# Patient Record
Sex: Male | Born: 1975 | Hispanic: Yes | Marital: Married | Smoking: Never smoker
Health system: Southern US, Community
[De-identification: ages and names within clinical notes are randomized; demographics above are authoritative.]

## PROBLEM LIST (undated history)

## (undated) DIAGNOSIS — F32A Depression, unspecified: Secondary | ICD-10-CM

## (undated) DIAGNOSIS — Z7689 Persons encountering health services in other specified circumstances: Secondary | ICD-10-CM

## (undated) DIAGNOSIS — I10 Essential (primary) hypertension: Secondary | ICD-10-CM

## (undated) DIAGNOSIS — F329 Major depressive disorder, single episode, unspecified: Secondary | ICD-10-CM

## (undated) HISTORY — DX: Essential (primary) hypertension: I10

## (undated) HISTORY — PX: SPLENECTOMY, TOTAL: SHX788

## (undated) HISTORY — PX: HERNIA REPAIR: SHX51

---
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
## 2001-06-03 ENCOUNTER — Encounter: Payer: Self-pay | Admitting: Emergency Medicine

## 2001-06-03 ENCOUNTER — Emergency Department (HOSPITAL_COMMUNITY): Admission: EM | Admit: 2001-06-03 | Discharge: 2001-06-03 | Payer: Self-pay | Admitting: Emergency Medicine

## 2003-10-30 ENCOUNTER — Emergency Department (HOSPITAL_COMMUNITY): Admission: EM | Admit: 2003-10-30 | Discharge: 2003-10-30 | Payer: Self-pay | Admitting: Family Medicine

## 2010-07-23 ENCOUNTER — Ambulatory Visit (HOSPITAL_COMMUNITY): Admission: RE | Admit: 2010-07-23 | Discharge: 2010-07-23 | Payer: Self-pay | Admitting: Surgery

## 2010-11-18 LAB — BASIC METABOLIC PANEL
BUN: 11 mg/dL (ref 6–23)
CO2: 27 mEq/L (ref 19–32)
Calcium: 9.4 mg/dL (ref 8.4–10.5)
Chloride: 105 mEq/L (ref 96–112)
Creatinine, Ser: 0.64 mg/dL (ref 0.4–1.5)
GFR calc Af Amer: >60 mL/min (ref >60)
GFR calc non Af Amer: >60 mL/min (ref >60)
Glucose, Bld: 92 mg/dL (ref 70–99)
Potassium: 4 mEq/L (ref 3.5–5.1)
Sodium: 141 mEq/L (ref 135–145)

## 2010-11-18 LAB — CBC
HCT: 43 % (ref 39.0–52.0)
Hemoglobin: 14.8 g/dL (ref 13.0–17.0)
MCH: 32.3 pg (ref 26.0–34.0)
MCHC: 34.3 g/dL (ref 30.0–36.0)
MCV: 94.2 fL (ref 78.0–100.0)
Platelets: 380 10*3/uL (ref 150–400)
RBC: 4.57 MIL/uL (ref 4.22–5.81)
RDW: 13.4 % (ref 11.5–15.5)
WBC: 6.6 10*3/uL (ref 4.0–10.5)

## 2010-11-18 LAB — DIFFERENTIAL
Basophils Absolute: 0.1 10*3/uL (ref 0.0–0.1)
Basophils Relative: 1 % (ref 0–1)
Eosinophils Absolute: 0.2 10*3/uL (ref 0.0–0.7)
Eosinophils Relative: 3 % (ref 0–5)
Lymphocytes Relative: 50 % — ABNORMAL HIGH (ref 12–46)
Lymphs Abs: 3.3 10*3/uL (ref 0.7–4.0)
Monocytes Absolute: 0.7 10*3/uL (ref 0.1–1.0)
Monocytes Relative: 10 % (ref 3–12)
Neutro Abs: 2.4 10*3/uL (ref 1.7–7.7)
Neutrophils Relative %: 36 % — ABNORMAL LOW (ref 43–77)

## 2010-11-18 LAB — SURGICAL PCR SCREEN
MRSA, PCR: NEGATIVE
Staphylococcus aureus: POSITIVE — AB

## 2010-12-24 ENCOUNTER — Other Ambulatory Visit (HOSPITAL_COMMUNITY): Payer: Self-pay | Admitting: Urology

## 2010-12-24 DIAGNOSIS — N410 Acute prostatitis: Secondary | ICD-10-CM

## 2010-12-30 ENCOUNTER — Ambulatory Visit (HOSPITAL_COMMUNITY)
Admission: RE | Admit: 2010-12-30 | Discharge: 2010-12-30 | Disposition: A | Discharge status: Home / Self Care | Payer: Self-pay | Source: Ambulatory Visit | Attending: Urology | Admitting: Urology

## 2010-12-30 ENCOUNTER — Other Ambulatory Visit (HOSPITAL_COMMUNITY): Payer: Self-pay | Admitting: Urology

## 2010-12-30 DIAGNOSIS — A499 Bacterial infection, unspecified: Secondary | ICD-10-CM | POA: Insufficient documentation

## 2010-12-30 DIAGNOSIS — R350 Frequency of micturition: Secondary | ICD-10-CM | POA: Insufficient documentation

## 2010-12-30 DIAGNOSIS — Z9089 Acquired absence of other organs: Secondary | ICD-10-CM | POA: Insufficient documentation

## 2010-12-30 DIAGNOSIS — N509 Disorder of male genital organs, unspecified: Secondary | ICD-10-CM | POA: Insufficient documentation

## 2010-12-30 DIAGNOSIS — B9689 Other specified bacterial agents as the cause of diseases classified elsewhere: Secondary | ICD-10-CM | POA: Insufficient documentation

## 2010-12-30 DIAGNOSIS — N411 Chronic prostatitis: Secondary | ICD-10-CM | POA: Insufficient documentation

## 2010-12-30 DIAGNOSIS — R300 Dysuria: Secondary | ICD-10-CM | POA: Insufficient documentation

## 2010-12-30 DIAGNOSIS — N410 Acute prostatitis: Secondary | ICD-10-CM

## 2010-12-30 DIAGNOSIS — K7689 Other specified diseases of liver: Secondary | ICD-10-CM | POA: Insufficient documentation

## 2010-12-30 MED ORDER — IOHEXOL 300 MG/ML  SOLN
100.0000 mL | Freq: Once | INTRAMUSCULAR | Status: AC | PRN
  Administered 2010-12-30: 100 mL via INTRAVENOUS

## 2017-10-15 ENCOUNTER — Ambulatory Visit: Payer: Self-pay | Admitting: Family Medicine

## 2017-10-15 ENCOUNTER — Encounter: Payer: Self-pay | Admitting: Psychology

## 2017-10-15 ENCOUNTER — Encounter: Payer: Self-pay | Admitting: Family Medicine

## 2017-10-15 ENCOUNTER — Other Ambulatory Visit: Payer: Self-pay

## 2017-10-15 VITALS — BP 128/88 | HR 78 | Temp 98.3°F | Ht 63.0 in | Wt 231.0 lb

## 2017-10-15 DIAGNOSIS — Z7689 Persons encountering health services in other specified circumstances: Secondary | ICD-10-CM

## 2017-10-15 DIAGNOSIS — G629 Polyneuropathy, unspecified: Secondary | ICD-10-CM

## 2017-10-15 DIAGNOSIS — Z9081 Acquired absence of spleen: Secondary | ICD-10-CM | POA: Insufficient documentation

## 2017-10-15 DIAGNOSIS — F329 Major depressive disorder, single episode, unspecified: Secondary | ICD-10-CM

## 2017-10-15 DIAGNOSIS — F32A Depression, unspecified: Secondary | ICD-10-CM

## 2017-10-15 HISTORY — DX: Polyneuropathy, unspecified: G62.9

## 2017-10-15 HISTORY — DX: Persons encountering health services in other specified circumstances: Z76.89

## 2017-10-15 MED ORDER — GABAPENTIN 100 MG PO CAPS: 100.0000 mg | ORAL_CAPSULE | Freq: Three times a day (TID) | ORAL | 3 refills | Status: DC

## 2017-10-15 NOTE — Assessment & Plan Note (Signed)
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  He would like to defer and obtain his prior records.   We discussed his increased risk and need for pnuemococcal, meningococcal, hemop vax

## 2017-10-15 NOTE — Patient Instructions (Addendum)
It was a pleasure to see you today! Thank you for choosing [facility] for your primary care. Eddie [name] was seen for establishing case. Come back to the clinic if you have any new concerns, and go to the emergency room if you have any life threatening problems or concerns of self-harm.  Today we reviewed some of your medical history and medications.   We also connected you with our behavioral health dept. For help with the depression symptoms we discussed.  We're going to continue your prior gabapentin medication.  We'll continue your general health screening when you get your insurance arranged because we know you are concerned about costs.  If we did any lab work today, and the results require attention, either me or my nurse will get in touch with you. If everything is normal, you will get a letter in mail and a message via . If you don't hear from [place] in two weeks, please give [place] a call. Otherwise, we look forward to seeing you again at your next visit. If you have any questions or concerns before then, please call the clinic at [phone].  Please bring all your medications to every doctors visit  Sign up for My Chart to have easy access to your labs results, and communication with your Primary care physician.    Please check-out at the front desk before leaving the clinic.    Best,  Dr. Sherene Sires FAMILY MEDICINE RESIDENT - PGY1 10/15/2017 2:34 PM

## 2017-10-15 NOTE — Progress Notes (Signed)
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   Presenting Issue: Patient reported feeling stress related to financial difficulties and damage to his home. He reported experiencing negative thoughts about self and the future, lack of interest in socialiazing or engagnig in previously enjoyed activities, feeling agitated and having troubles concentrating.   Report of symptoms:  Symptoms began roughly 6 months ago after the damage to his house and related financial stress.  Impact on function: Increased gitation with wife, has stopped playing soccer, and has trouble focusing at school.   Independently, wife reported patient is much more agitated with her and at times is physical with her, which is a change in behavior.  Psychiatric History - Diagnoses: none - Hospitalizations: none - Pharmacotherapy: Yes, but could not recall medication name - Outpatient therapy: marriage counseling but stopped after several sessions because he does "not believe" in counseling.   Family history of psychiatric issues:  None   Current and history of substance use:  denied all   PHQ-9: 14, denied item 9 SI   Warmhandoff:  complete

## 2017-10-15 NOTE — Assessment & Plan Note (Signed)
Assessment / Plan / Recommendations:  Patient presented with moderate depressive symptoms (PHQ9 =14, denied item 9, SI). His affect was blunted, he avoided eye-contact throughout the appointment but was forthcoming with information.   In the past 6 months, patient has been dealing with a lot of stress related to damage to his house resulting in financial difficulties, which likely contributed to his initial symptoms. Currently, his social isolation and disengagement from hobbies, such as soccer, likely maintain his depressive symptoms. Additionally, his symptoms are impacting his relationship with his wife, as he has become much more irritable. [facility] provided psychoeducation about depression and the link between withdrawing and maintenance of symptoms. Though initially patient had expressed some reluctance about engaging in therapy, he was open to seeing [facility] again and will return for appointment on 03/08.

## 2017-10-15 NOTE — Progress Notes (Signed)
ROI signed and placed in to be faxed pile for [employer] surgery and [employer]. Artelia Game, Salome Spotted, CMA

## 2017-10-15 NOTE — Assessment & Plan Note (Signed)
Bilateral heel spurs seen by ortho in past, will offer low dose gabapentin without having access to prior records

## 2017-10-15 NOTE — Assessment & Plan Note (Addendum)
Denies si/hi.  Refuses medication discussion.  Agreed to warm hand off.  PHQ 14

## 2017-10-15 NOTE — Progress Notes (Signed)
    Subjective:  Eddie [name] is a 42 y.o. male who presents to the [facility] today with a chief complaint of establishing care.   HPI: Patient presents to establish care.  He is newly back [place] the country from [place] and has has significant social stressors that are contributing to relationship problems and depressive symptoms (phq14).  He denies SI/HI.   Says he has had weight gain, stopped playing soccer, is stressed out about school/work/home repairs.  He is willing to speak with [facility] but refuses offers of medical assistance for depression.    His insurance is not in place yet so he refuses standard screenings/vaccines even though we discussed increased risks with a h/o splenectomy.  His only other surgery noted is a hernia repair that he says he has had no complications from.   Objective:  Physical Exam: BP 128/88   Pulse 78   Temp 98.3 F (36.8 C) (Oral)   Ht 5\' 3"  (1.6 m)   Wt 231 lb (104.8 kg)   SpO2 98%   BMI 40.92 kg/m   Gen: NAD, sitting comfortably, mildly obese CV: RRR with no murmurs appreciated Pulm: NWOB, CTAB with no crackles, wheezes, or rhonchi GI: Normal bowel sounds present. Soft, Nontender, Nondistended. MSK: no edema, cyanosis, or clubbing noted.  Some expressed neuropathy in heels but not to palpation. Skin: warm, dry Neuro: grossly normal, moves all extremities Psych: Normal affect and thought content, does express depressive apathy (phq9)  No results found for this or any previous visit (from the past 72 hour(s)).   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  He would like to defer and obtain his prior records.   We discussed his increased risk and need for pnuemococcal, meningococcal, hemop vax  Depression Denies si/hi.  Refuses medication discussion.  Agreed to warm hand off.  PHQ 14  Neuropathy Bilateral heel spurs seen  by ortho in past, will offer low dose gabapentin without having access to prior records   Sherene Sires, [place] - PGY1 10/15/2017 3:36 PM

## 2017-11-12 ENCOUNTER — Ambulatory Visit: Payer: Self-pay

## 2019-04-10 ENCOUNTER — Ambulatory Visit: Payer: BLUE CROSS/BLUE SHIELD | Admitting: Family Medicine

## 2019-04-12 ENCOUNTER — Encounter: Payer: Self-pay | Admitting: Family Medicine

## 2019-04-12 ENCOUNTER — Other Ambulatory Visit: Payer: Self-pay

## 2019-04-12 ENCOUNTER — Ambulatory Visit (INDEPENDENT_AMBULATORY_CARE_PROVIDER_SITE_OTHER): Payer: Self-pay | Admitting: Family Medicine

## 2019-04-12 VITALS — BP 112/86 | HR 89 | Wt 245.2 lb

## 2019-04-12 DIAGNOSIS — F329 Major depressive disorder, single episode, unspecified: Secondary | ICD-10-CM

## 2019-04-12 DIAGNOSIS — Z0000 Encounter for general adult medical examination without abnormal findings: Secondary | ICD-10-CM

## 2019-04-12 DIAGNOSIS — F32A Depression, unspecified: Secondary | ICD-10-CM

## 2019-04-12 MED ORDER — FLUOXETINE HCL 10 MG PO CAPS: 10.0000 mg | ORAL_CAPSULE | Freq: Every day | ORAL | 1 refills | Status: DC

## 2019-04-12 NOTE — Patient Instructions (Signed)
It was a pleasure to see you today! Thank you for choosing [facility] for your primary care. Eddie [name] was seen for depression. Come back to the clinic in 5 weeks.   Today we talked about starting your depression medicine.  We ordered the Prozac because it is very expensive and you can get this at [facility] for good pricing cash.  We talked about how you need to follow-up with me in about 5 weeks.  At that point we can decide if this is a medicine that is working for you or if we need to go up on the dose.  If at any point you start having negative symptoms or this seems to be causing significant problems for you including any thoughts of harming yourself, please stop the medicine immediately and call me back and we can talk about the next step.   Please bring all your medications to every doctors visit   Sign up for My Chart to have easy access to your labs results, and communication with your Primary care physician.     Please check-out at the front desk before leaving the clinic.     Best,  Dr. Sherene Sires FAMILY MEDICINE RESIDENT - PGY3 04/12/2019 3:18 PM

## 2019-04-13 ENCOUNTER — Encounter: Payer: Self-pay | Admitting: Family Medicine

## 2019-04-13 DIAGNOSIS — Z0000 Encounter for general adult medical examination without abnormal findings: Secondary | ICD-10-CM | POA: Insufficient documentation

## 2019-04-13 NOTE — Assessment & Plan Note (Signed)
Patient said that his depression has been impacting his relationship at home and is willing to consider medication at this point.  He said in the past he had been on Xanax briefly and wanted to know if we could do that.  He did have significant findings on his gad and PDS Q9 with scores of 18 and 23.  He said that he did have occasional feelings of wondering if he needed to be on this earth but firmly denied any thoughts or intentions of actually harming himself and said he would never lay hands on his family.  He is quite adamant that he is safe but afraid he is harming his relationship with his depression.  We discussed pros and cons of medication he said he was on sertraline in the past and did not think that was useful for him.  We discussed positive and negative effects of medications and that positive can take up to 5 to 6 weeks to appear.  He agreed to start on fluoxetine so we started on low-dose fluoxetine and will reevaluate in 5 to 6 weeks, we went over return precautions to the ED into the clinic in case negative effects occur including suicidal thoughts..  Patient is self-pay and declines referral to therapy and psychiatry because he has would not be able to afford it.

## 2019-04-13 NOTE — Progress Notes (Signed)
    Subjective:  Eddie [name] is a 43 y.o. male who presents to the [facility] today with a chief complaint of depression.   HPI: Depression Patient said that his depression has been impacting his relationship at home and is willing to consider medication at this point.  He said in the past he had been on Xanax briefly and wanted to know if we could do that.  He did have significant findings on his gad and PDS Q9 with scores of 18 and 23.  He said that he did have occasional feelings of wondering if he needed to be on this earth but firmly denied any thoughts or intentions of actually harming himself and said he would never lay hands on his family.  He is quite adamant that he is safe but afraid he is harming his relationship with his depression.  Healthcare maintenance Patient self pay and will go to the, health department to get his HIV drawn.  Objective:  Physical Exam: BP 112/86   Pulse 89   Wt 245 lb 3.2 oz (111.2 kg)   SpO2 95%   BMI 43.44 kg/m   Gen: NAD, resting comfortably MSK: no edema, cyanosis, or clubbing noted Skin: warm, dry Neuro: grossly normal, moves all extremities Psych: Normal affect and thought content  No results found for this or any previous visit (from the past 72 hour(s)).   Assessment/Plan:  Depression Patient said that his depression has been impacting his relationship at home and is willing to consider medication at this point.  He said in the past he had been on Xanax briefly and wanted to know if we could do that.  He did have significant findings on his gad and PDS Q9 with scores of 18 and 23.  He said that he did have occasional feelings of wondering if he needed to be on this earth but firmly denied any thoughts or intentions of actually harming himself and said he would never lay hands on his family.  He is quite adamant that he is safe but afraid he is harming his relationship with his depression.  We discussed pros and cons of medication he said he was  on sertraline in the past and did not think that was useful for him.  We discussed positive and negative effects of medications and that positive can take up to 5 to 6 weeks to appear.  He agreed to start on fluoxetine so we started on low-dose fluoxetine and will reevaluate in 5 to 6 weeks, we went over return precautions to the ED into the clinic in case negative effects occur including suicidal thoughts..  Patient is self-pay and declines referral to therapy and psychiatry because he has would not be able to afford it.  Healthcare maintenance Patient self pay and will go to the, health department to get his HIV drawn.   Sherene Sires, DO FAMILY MEDICINE RESIDENT - PGY3 04/13/2019 10:57 AM \

## 2019-04-13 NOTE — Assessment & Plan Note (Signed)
Patient self pay and will go to the, health department to get his HIV drawn.

## 2019-06-12 ENCOUNTER — Telehealth: Payer: Self-pay

## 2019-06-12 ENCOUNTER — Other Ambulatory Visit: Payer: Self-pay

## 2019-06-12 DIAGNOSIS — F329 Major depressive disorder, single episode, unspecified: Secondary | ICD-10-CM

## 2019-06-12 DIAGNOSIS — G629 Polyneuropathy, unspecified: Secondary | ICD-10-CM

## 2019-06-12 DIAGNOSIS — F32A Depression, unspecified: Secondary | ICD-10-CM

## 2019-06-12 MED ORDER — FLUOXETINE HCL 10 MG PO CAPS: 10.0000 mg | ORAL_CAPSULE | Freq: Every day | ORAL | 1 refills | Status: DC

## 2019-06-12 MED ORDER — GABAPENTIN 100 MG PO CAPS: 100.0000 mg | ORAL_CAPSULE | Freq: Three times a day (TID) | ORAL | 3 refills | Status: DC

## 2019-06-12 NOTE — Telephone Encounter (Signed)
Pt wife [name] calling to get Prozac refilled at an increase dose from 10 to 20 mg. Patient would also like a 3 month supply of tablets.  Please send to [facility] on [address]. Ottis Stain, CMA

## 2019-06-12 NOTE — Telephone Encounter (Signed)
Patients wife calls back and apologizes for rushing off. Pharmacy calcification, [facility] [address]. Will cancel at [facility] and call to preferred pharmacy.

## 2019-06-12 NOTE — Telephone Encounter (Signed)
Acknowledge the patient's wife called in saying that they wanted to increase Prozac to 20 mg from 10 mg.  Given the amount of time that he has been on the 10 mg I would be open this discussion but we do need to have it, patient needs to be scheduled for at least a telemedicine visit with 1 of our physicians before this can be increased.  Dr. Criss Rosales

## 2019-06-12 NOTE — Telephone Encounter (Signed)
Patients wife calls nurse line stating she has been trying to get his fluoxetine and gabapentin refilled. I informed her they were approved and more than likely ready for pick up at [facility]. Pts wife then stated, "they were not supposed to go to [place], but [facility] on [address]." Patient was then speaking with someone else, and was not clear which [facility], as in chart they use the one on [address]. Patient then stated, "my doctor is in I have to go."

## 2019-06-13 ENCOUNTER — Telehealth (INDEPENDENT_AMBULATORY_CARE_PROVIDER_SITE_OTHER): Payer: Self-pay | Admitting: Family Medicine

## 2019-06-13 ENCOUNTER — Other Ambulatory Visit: Payer: Self-pay

## 2019-06-13 ENCOUNTER — Other Ambulatory Visit: Payer: Self-pay | Admitting: Family Medicine

## 2019-06-13 DIAGNOSIS — F32A Depression, unspecified: Secondary | ICD-10-CM

## 2019-06-13 DIAGNOSIS — F329 Major depressive disorder, single episode, unspecified: Secondary | ICD-10-CM

## 2019-06-13 MED ORDER — FLUOXETINE HCL 20 MG PO CAPS: 20.0000 mg | ORAL_CAPSULE | Freq: Every day | ORAL | 1 refills | Status: DC

## 2019-06-13 NOTE — Progress Notes (Signed)
Virtual Visit via Telephone Note  I connected with Loma Boston on 06/13/19 at  8:30 AM EDT by telephone and verified that I am speaking with the correct person using two identifiers.  Location: Patient: Home Provider: [facility]   I discussed the limitations, risks, security and privacy concerns of performing an evaluation and management service by telephone and the availability of in person appointments. I also discussed with the patient that there may be a patient responsible charge related to this service. The patient expressed understanding and agreed to proceed.   History of Present Illness: Patient with history of depression, has been impacting his relationship with his wife.  In the past he had used Xanax before establishing here but we discussed changing to an SSRI.  No history of bipolar, denies suicidal or homicidal ideation.  He said that he has been doing well on Prozac 10 mg but thought that it was not quite doing enough.  He said specifically there are no problems with this medication.   Observations/Objective: Patient speaking in full sentences, understands the concept of positive negative symptoms.  Processing information appropriately  Assessment and Plan: Will increase dose from 10 mg to 20 mg Prozac.  Reconfirmed concept of positive versus negative symptoms.  If negative symptoms patient will call back immediately and discussed with [place] to consider stopping, if positive symptoms do not reach desired effect we can discuss additional medication or increased dosing at 5 to 6 weeks.  Follow Up Instructions:    I discussed the assessment and treatment plan with the patient. The patient was provided an opportunity to ask questions and all were answered. The patient agreed with the plan and demonstrated an understanding of the instructions.   The patient was advised to call back or seek an in-person evaluation if the symptoms worsen or if the condition fails to improve as  anticipated.  I provided 8 minutes of non-face-to-face time during this encounter.   Sherene Sires, DO

## 2019-06-13 NOTE — Telephone Encounter (Signed)
Tele med visit completed today. Ottis Stain, CMA

## 2019-07-31 ENCOUNTER — Other Ambulatory Visit: Payer: Self-pay | Admitting: Family Medicine

## 2019-07-31 DIAGNOSIS — F329 Major depressive disorder, single episode, unspecified: Secondary | ICD-10-CM

## 2019-07-31 DIAGNOSIS — F32A Depression, unspecified: Secondary | ICD-10-CM

## 2019-09-04 ENCOUNTER — Telehealth: Payer: Self-pay | Admitting: Family Medicine

## 2019-09-04 DIAGNOSIS — F329 Major depressive disorder, single episode, unspecified: Secondary | ICD-10-CM

## 2019-09-04 DIAGNOSIS — F32A Depression, unspecified: Secondary | ICD-10-CM

## 2019-09-04 NOTE — Telephone Encounter (Signed)
Wife also LM nurse line.   - Is requesting that gabapentin be increased to 400mg   - is requesting a refill on Fluoxetine.  Christen Bame, CMA

## 2019-09-04 NOTE — Telephone Encounter (Signed)
Wife is calling because her husband needs refill on his depression medication and see if he can increase the dosage on his Gabapentin. Marland Kitchen Please call to discuss.

## 2019-09-05 ENCOUNTER — Other Ambulatory Visit: Payer: Self-pay | Admitting: Family Medicine

## 2019-09-05 DIAGNOSIS — F329 Major depressive disorder, single episode, unspecified: Secondary | ICD-10-CM

## 2019-09-05 DIAGNOSIS — F32A Depression, unspecified: Secondary | ICD-10-CM

## 2019-09-05 MED ORDER — FLUOXETINE HCL 20 MG PO CAPS: 20.0000 mg | ORAL_CAPSULE | Freq: Every day | ORAL | 0 refills | Status: DC

## 2019-09-05 NOTE — Progress Notes (Signed)
Patient requesting refill on Fluoxetine which I will send to the pharmacy. Increase in Gabapentin dose should be discussed with PCP.

## 2019-09-06 NOTE — Telephone Encounter (Signed)
Attempted to call pt/wife.  No answer and no VM. Christen Bame, CMA

## 2019-09-07 ENCOUNTER — Other Ambulatory Visit: Payer: Self-pay | Admitting: Family Medicine

## 2019-09-07 DIAGNOSIS — F32A Depression, unspecified: Secondary | ICD-10-CM

## 2019-09-07 DIAGNOSIS — F329 Major depressive disorder, single episode, unspecified: Secondary | ICD-10-CM

## 2019-09-09 ENCOUNTER — Telehealth: Payer: Self-pay | Admitting: Family Medicine

## 2019-09-09 NOTE — Telephone Encounter (Signed)
FPTS After Hours Note  Patient's wife [name] calls to report that [name] has had severe depression after "almost dying" due to Covid.  She says that he is currently crying and she is incredibly stressed because of his health conditions.  She says that she called the after-hours line recently and was told that medication would be sent in for him but it was not available at the [facility] that they use.  Unfortunately, patient's wife was very upset during our conversation, so I could not elicit much detail.  It appears that patient's fluoxetine was sent in on 12/29 due to a [facility] rather than the pharmacy that they wanted.  I told the patient's wife that I would send in his medication today and to seek virtual or in-person care for further evaluation soon.  I could not see where patient has been seen for Covid in our EHR, so perhaps he was seen in [facility] or another location that is not in our system.  Thirty tablets of Fluoxetine 20 mg were sent to the [facility] on [address].  Will forward to patient's PCP as an FYI.  Dominiq Fontaine C. Shan Levans, MD PGY-3, [facility] 09/09/2019 9:53 PM

## 2019-09-09 NOTE — Telephone Encounter (Signed)
Entered in error

## 2019-09-16 ENCOUNTER — Telehealth: Payer: Self-pay | Admitting: Family Medicine

## 2019-09-16 NOTE — Telephone Encounter (Signed)
Received after hour page from patient and his wife on the phone, primary request was for antibiotics for what they believed to be an ear infection.  Timeline of recent illness progression described below  12/9 fever/muscle pain/ 100.5/ no taste 12/14 [facility] to [facility], was given an abx for 7 days.  Does not know which one 12/14 covid negative at [facility]  As of today patient has no fever or muscle aches, though stopped "weeks ago "he still has some deficits in taste and smell.  He has a "minor earache "that just has not resolved.  They said that they had called and somebody had promised them an antibiotic over the phone and that I needed to release it.  I described to them that I did not think that sounded like our standard procedure and it was possible they were speaking to a physician in another system.  I told him that we do not prescribe antibiotics over the phone as a matter of policy and that his symptoms started mild enough on the phone for to be appropriate to offer an outpatient visit Monday in our clinic.  When I offered this he told me that he did not think it was hurting enough to make him want to come in for an appointment and that he would call me if things got worse.  Dr. Criss Rosales

## 2019-10-02 ENCOUNTER — Other Ambulatory Visit: Payer: Self-pay

## 2019-10-02 ENCOUNTER — Ambulatory Visit (INDEPENDENT_AMBULATORY_CARE_PROVIDER_SITE_OTHER): Payer: Self-pay | Admitting: Family Medicine

## 2019-10-02 VITALS — BP 136/92 | HR 109 | Wt 243.8 lb

## 2019-10-02 DIAGNOSIS — H938X1 Other specified disorders of right ear: Secondary | ICD-10-CM

## 2019-10-02 DIAGNOSIS — F329 Major depressive disorder, single episode, unspecified: Secondary | ICD-10-CM

## 2019-10-02 DIAGNOSIS — H9201 Otalgia, right ear: Secondary | ICD-10-CM

## 2019-10-02 DIAGNOSIS — F32A Depression, unspecified: Secondary | ICD-10-CM

## 2019-10-02 MED ORDER — FLUTICASONE PROPIONATE 50 MCG/ACT NA SUSP: 2.0000 | Freq: Every day | NASAL | 6 refills | Status: DC

## 2019-10-02 MED ORDER — FLUOXETINE HCL 20 MG PO CAPS: 20.0000 mg | ORAL_CAPSULE | Freq: Every day | ORAL | 0 refills | Status: DC

## 2019-10-02 NOTE — Progress Notes (Signed)
     Subjective: Chief Complaint  Patient presents with  . right ear pain     HPI: Eddie [name] is a 44 y.o. presenting to clinic today to discuss the following:  Right Ear Pain Patient states he has a history of fluid and pressure in his ears along with history of multiple ear infections in the past. He got sick around 12/19 and had an ear infection which was treated with antibiotics. He states since then he has noticed a "popping" sensation and noise in his right ear whenever he drinks liquids, not when eating. He has no jaw pain or tenderness. He has no difficulty swallowing liquids or solids. He also endorses the sensation of his right ear feeling full. Denies congestion, fever, chills, rhinorrhea, sore throat, or cough.     ROS noted in HPI.    Social History   Tobacco Use  Smoking Status Never Smoker  Smokeless Tobacco Never Used    Objective: BP (!) 136/92   Pulse (!) 109   Wt 243 lb 12.8 oz (110.6 kg)   SpO2 97%   BMI 43.19 kg/m  Vitals and nursing notes reviewed  Physical Exam Gen: Alert and Oriented x 3, NAD HEENT: Normocephalic, atraumatic, PERRLA, EOMI, Left TM visible with good light reflex, Right TM non-bulging but dull, non-purulent fluid seen behind TM.  Neck: trachea midline, no LAD Ext: no clubbing, cyanosis, or edema Skin: warm, dry, intact, no rashes  Assessment/Plan:  Right ear pain Acute on chronic, stable. Most likely from multiple previous ear infections in the past. He did have fluid behind the right TM on exam with some evidence of scar tissue in the ear - Flonase to help drain fluid out of inner ear; 2 spray each nostril BID for 14 days - Referral to ENT as patient expressed desire to have tympanic tubes placed   PATIENT EDUCATION PROVIDED: See AVS    Diagnosis and plan along with any newly prescribed medication(s) were discussed in detail with this patient today. The patient verbalized understanding and agreed with the plan. Patient  advised if symptoms worsen return to clinic or ER.    Orders Placed This Encounter  Procedures  . Ambulatory referral to ENT    Referral Priority:   Routine    Referral Type:   Consultation    Referral Reason:   Specialty Services Required    Requested Specialty:   Otolaryngology    Number of Visits Requested:   1    Meds ordered this encounter  Medications  . DISCONTD: fluticasone (FLONASE) 50 MCG/ACT nasal spray    Sig: Place 2 sprays into both nostrils daily.    Dispense:  16 g    Refill:  6  . FLUoxetine (PROZAC) 20 MG capsule    Sig: Take 1 capsule (20 mg total) by mouth daily.    Dispense:  30 capsule    Refill:  0  . fluticasone (FLONASE) 50 MCG/ACT nasal spray    Sig: Place 2 sprays into both nostrils daily.    Dispense:  16 g    Refill:  Brandon, DO 10/02/2019, 4:26 PM PGY-3 [place]

## 2019-10-02 NOTE — Patient Instructions (Signed)
It was great to meet you today! Thank you for letting me participate in your care!  Today, we discussed your right ear fullness that has been going on for some time off and on. I have referred you to ENT for a possible procedure to insert tubes into your ears to help with chronic drainage issues. In the meantime I have sent in a prescription for flonase to help with sinus drainage that will help you ear fullness.  I have also given you a refill for Fluoxetine for one month. All further requests for refills need to be directed to your PCP.  Be well, Harolyn Rutherford, DO PGY-3, [facility]

## 2019-10-03 DIAGNOSIS — H9201 Otalgia, right ear: Secondary | ICD-10-CM | POA: Insufficient documentation

## 2019-10-03 NOTE — Assessment & Plan Note (Signed)
Acute on chronic, stable. Most likely from multiple previous ear infections in the past. He did have fluid behind the right TM on exam with some evidence of scar tissue in the ear - Flonase to help drain fluid out of inner ear; 2 spray each nostril BID for 14 days - Referral to ENT as patient expressed desire to have tympanic tubes placed

## 2019-10-17 ENCOUNTER — Telehealth: Payer: Self-pay

## 2019-10-17 NOTE — Telephone Encounter (Signed)
[name] (Pt's wife), calls nurse line regarding issues with built up fluid in patient's ears. Requesting patient be sent in rx for antibiotic ear drops to help clear up issue. States that he is still having pain in ears and difficulty hearing.   Per patient, they have not follow up with ENT due to financial issues.   Patient also reports continued decreased smell and taste.   To PCP  Please advise  Talbot Grumbling, RN

## 2019-10-20 NOTE — Telephone Encounter (Signed)
LVM to call office back to give them the below message and to assist in getting this appointment scheduled if able. April Zimmerman Rumple, CMA

## 2019-10-24 ENCOUNTER — Ambulatory Visit (HOSPITAL_COMMUNITY)
Admission: EM | Admit: 2019-10-24 | Discharge: 2019-10-24 | Disposition: A | Discharge status: Home / Self Care | Payer: HRSA Program | Attending: Family Medicine | Admitting: Family Medicine

## 2019-10-24 ENCOUNTER — Ambulatory Visit: Payer: Self-pay | Admitting: Family Medicine

## 2019-10-24 ENCOUNTER — Other Ambulatory Visit: Payer: Self-pay

## 2019-10-24 ENCOUNTER — Encounter (HOSPITAL_COMMUNITY): Payer: Self-pay | Admitting: Emergency Medicine

## 2019-10-24 DIAGNOSIS — H6983 Other specified disorders of Eustachian tube, bilateral: Secondary | ICD-10-CM | POA: Diagnosis present

## 2019-10-24 DIAGNOSIS — F329 Major depressive disorder, single episode, unspecified: Secondary | ICD-10-CM | POA: Diagnosis not present

## 2019-10-24 DIAGNOSIS — Z79899 Other long term (current) drug therapy: Secondary | ICD-10-CM | POA: Insufficient documentation

## 2019-10-24 DIAGNOSIS — R0981 Nasal congestion: Secondary | ICD-10-CM | POA: Diagnosis not present

## 2019-10-24 DIAGNOSIS — Z20822 Contact with and (suspected) exposure to covid-19: Secondary | ICD-10-CM | POA: Diagnosis not present

## 2019-10-24 DIAGNOSIS — G629 Polyneuropathy, unspecified: Secondary | ICD-10-CM | POA: Insufficient documentation

## 2019-10-24 HISTORY — DX: Depression, unspecified: F32.A

## 2019-10-24 MED ORDER — PREDNISONE 20 MG PO TABS: 40.0000 mg | ORAL_TABLET | Freq: Every day | ORAL | 0 refills | Status: AC

## 2019-10-24 MED ORDER — CETIRIZINE HCL 10 MG PO CAPS: 10.0000 mg | ORAL_CAPSULE | Freq: Every day | ORAL | 0 refills | Status: DC

## 2019-10-24 NOTE — Discharge Instructions (Signed)
No sign of infection in ears Begin prednisone 40 mg daily for 5 days, please take with food and in the morning Continue Flonase, add in Zyrtec daily Covid swab pending, monitor my chart for results

## 2019-10-24 NOTE — ED Triage Notes (Signed)
Complains of both ears hurting, pain for 3 weeks.  Patient reports runny nose.  Denies cough Requesting covid test

## 2019-10-24 NOTE — ED Provider Notes (Signed)
[facility]    CSN: [id number] Arrival date & time: 10/24/19  0857      History   Chief Complaint Chief Complaint  Patient presents with  . Otalgia    HPI Eddie [name] is a 44 y.o. male history of prior splenectomy presenting today for evaluation of bilateral ear pain.  Patient states that over the past 3 weeks he has had muffled and popping sensations in his ears.  Symptoms will come and go.  Does report associated pain, but this will come and go with Tylenol.  He has also had some nasal congestion over the past week, this is mainly noted in the morning.  He was previously seen for this and given Flonase nasal spray.  He has not had relief with consistent use of this.  He denies any cough.  Is requesting Covid testing, denies known exposure.  Denies any GI symptoms.  HPI  Past Medical History:  Diagnosis Date  . Depression   . Encounter to establish care 10/15/2017    Patient Active Problem List   Diagnosis Date Noted  . Right ear pain 10/03/2019  . Healthcare maintenance 04/13/2019  . Depression 10/15/2017  . Neuropathy 10/15/2017  . H/O splenectomy 10/15/2017    Past Surgical History:  Procedure Laterality Date  . HERNIA REPAIR    . SPLENECTOMY, TOTAL         Home Medications    Prior to Admission medications   Medication Sig Start Date End Date Taking? Authorizing Provider  FLUoxetine (PROZAC) 20 MG capsule Take 1 capsule (20 mg total) by mouth daily. 10/02/19  Yes Lockamy, Timothy, DO  fluticasone (FLONASE) 50 MCG/ACT nasal spray Place 2 sprays into both nostrils daily. 10/02/19  Yes Lockamy, Timothy, DO  gabapentin (NEURONTIN) 100 MG capsule Take 1 capsule (100 mg total) by mouth 3 (three) times daily. 06/12/19  Yes Sherene Sires, DO  Cetirizine HCl 10 MG CAPS Take 1 capsule (10 mg total) by mouth daily. 10/24/19   Dorleen Kissel C, PA-C  predniSONE (DELTASONE) 20 MG tablet Take 2 tablets (40 mg total) by mouth daily with breakfast for 5 days. 10/24/19  10/29/19  Aryianna Earwood, Elesa Hacker, PA-C    Family History Family History  Problem Relation Age of Onset  . Diabetes Mother   . Hypertension Mother   . Diabetes Father   . Hypertension Father     Social History Social History   Tobacco Use  . Smoking status: Never Smoker  . Smokeless tobacco: Never Used  Substance Use Topics  . Alcohol use: Never  . Drug use: Never     Allergies   Patient has no known allergies.   Review of Systems Review of Systems  Constitutional: Negative for activity change, appetite change, chills, fatigue and fever.  HENT: Positive for congestion, hearing loss and rhinorrhea. Negative for ear pain, sinus pressure, sore throat and trouble swallowing.   Eyes: Negative for discharge and redness.  Respiratory: Negative for cough, chest tightness and shortness of breath.   Cardiovascular: Negative for chest pain.  Gastrointestinal: Negative for abdominal pain, diarrhea, nausea and vomiting.  Musculoskeletal: Negative for myalgias.  Skin: Negative for rash.  Neurological: Negative for dizziness, light-headedness and headaches.     Physical Exam Triage Vital Signs ED Triage Vitals  Enc Vitals Group     BP 10/24/19 0922 (!) 159/100     Pulse Rate 10/24/19 0922 76     Resp 10/24/19 0922 20     Temp 10/24/19 [id number]  98.3 F (36.8 C)     Temp Source 10/24/19 0922 Oral     SpO2 10/24/19 0922 96 %     Weight --      Height --      Head Circumference --      Peak Flow --      Pain Score 10/24/19 0917 8     Pain Loc --      Pain Edu? --      Excl. in [place]? --    No data found.  Updated Vital Signs BP (!) 152/112 (BP Location: Left Arm) Comment (BP Location): large cuff  Pulse 86   Temp 98.3 F (36.8 C) (Oral)   Resp 20   SpO2 96%   Visual Acuity Right Eye Distance:   Left Eye Distance:   Bilateral Distance:    Right Eye Near:   Left Eye Near:    Bilateral Near:     Physical Exam Vitals and nursing note reviewed.  Constitutional:       Appearance: He is well-developed.     Comments: No acute distress  HENT:     Head: Normocephalic and atraumatic.     Ears:     Comments: Bilateral ears without tenderness to palpation of external auricle, tragus and mastoid, EAC's without erythema or swelling, TM's with good bony landmarks and cone of light. Non erythematous.     Nose: Nose normal.     Comments: Nasal mucosa pink, nonswollen turbinates    Mouth/Throat:     Comments: Oral mucosa pink and moist, no tonsillar enlargement or exudate. Posterior pharynx patent and nonerythematous, no uvula deviation or swelling. Normal phonation.  Eyes:     Conjunctiva/sclera: Conjunctivae normal.  Cardiovascular:     Rate and Rhythm: Normal rate.  Pulmonary:     Effort: Pulmonary effort is normal. No respiratory distress.     Comments: Breathing comfortably at rest, CTABL, no wheezing, rales or other adventitious sounds auscultated Abdominal:     General: There is no distension.  Musculoskeletal:        General: Normal range of motion.     Cervical back: Neck supple.  Skin:    General: Skin is warm and dry.  Neurological:     Mental Status: He is alert and oriented to person, place, and time.      UC Treatments / Results  Labs (all labs ordered are listed, but only abnormal results are displayed) Labs Reviewed  NOVEL CORONAVIRUS, NAA (HOSP ORDER, SEND-OUT TO REF LAB; TAT 18-24 HRS)    EKG   Radiology No results found.  Procedures Procedures (including critical care time)  Medications Ordered in UC Medications - No data to display  Initial Impression / Assessment and Plan / UC Course  I have reviewed the triage vital signs and the nursing notes.  Pertinent labs & imaging results that were available during my care of the patient were reviewed by me and considered in my medical decision making (see chart for details).     Ear exam not suggestive of otitis media or externa.  Even associated muffled and popping  sensations, likely underlying eustachian tube dysfunction.  Has not had relief with consistent use with Flonase.  Will do trial of prednisone x5 days.  Recommended following up with ENT.  They expressed concerns over financial ability to follow-up with specialist.  We will also add in Zyrtec along with Flonase to further help with congestion.  Covid PCR pending.  Continue to monitor, follow-up  if developing increased or worsening pain.  Discussed strict return precautions. Patient verbalized understanding and is agreeable with plan.  Final Clinical Impressions(s) / UC Diagnoses   Final diagnoses:  Eustachian tube dysfunction, bilateral     Discharge Instructions     No sign of infection in ears Begin prednisone 40 mg daily for 5 days, please take with food and in the morning Continue Flonase, add in Zyrtec daily Covid swab pending, monitor my chart for results   ED Prescriptions    Medication Sig Dispense Auth. Provider   predniSONE (DELTASONE) 20 MG tablet Take 2 tablets (40 mg total) by mouth daily with breakfast for 5 days. 10 tablet Curtisha Bendix C, PA-C   Cetirizine HCl 10 MG CAPS Take 1 capsule (10 mg total) by mouth daily. 20 capsule Kendel Bessey, Proberta C, PA-C     PDMP not reviewed this encounter.   Jenny Lai, Scipio C, PA-C 10/24/19 1011

## 2019-10-26 LAB — NOVEL CORONAVIRUS, NAA (HOSP ORDER, SEND-OUT TO REF LAB; TAT 18-24 HRS): SARS-CoV-2, NAA: NOT DETECTED

## 2019-10-27 NOTE — Telephone Encounter (Signed)
Pt was scheduled for an appointment and did not come, however it looks like he was seen at the urgent care for this on same day.Franchot Pollitt Zimmerman Rumple, CMA

## 2019-10-31 ENCOUNTER — Other Ambulatory Visit: Payer: Self-pay | Admitting: Family Medicine

## 2019-10-31 DIAGNOSIS — F32A Depression, unspecified: Secondary | ICD-10-CM

## 2019-10-31 DIAGNOSIS — F329 Major depressive disorder, single episode, unspecified: Secondary | ICD-10-CM

## 2019-12-01 ENCOUNTER — Other Ambulatory Visit: Payer: Self-pay | Admitting: Family Medicine

## 2019-12-01 DIAGNOSIS — F329 Major depressive disorder, single episode, unspecified: Secondary | ICD-10-CM

## 2019-12-01 DIAGNOSIS — F32A Depression, unspecified: Secondary | ICD-10-CM

## 2020-01-15 ENCOUNTER — Other Ambulatory Visit: Payer: Self-pay | Admitting: Family Medicine

## 2020-01-15 DIAGNOSIS — F32A Depression, unspecified: Secondary | ICD-10-CM

## 2020-01-15 DIAGNOSIS — F329 Major depressive disorder, single episode, unspecified: Secondary | ICD-10-CM

## 2020-01-24 ENCOUNTER — Other Ambulatory Visit: Payer: Self-pay

## 2020-01-24 ENCOUNTER — Ambulatory Visit (INDEPENDENT_AMBULATORY_CARE_PROVIDER_SITE_OTHER): Payer: Self-pay | Admitting: Family Medicine

## 2020-01-24 ENCOUNTER — Encounter: Payer: Self-pay | Admitting: Family Medicine

## 2020-01-24 VITALS — BP 120/78 | HR 92 | Ht 63.0 in | Wt 247.4 lb

## 2020-01-24 DIAGNOSIS — G629 Polyneuropathy, unspecified: Secondary | ICD-10-CM

## 2020-01-24 DIAGNOSIS — H9190 Unspecified hearing loss, unspecified ear: Secondary | ICD-10-CM

## 2020-01-24 DIAGNOSIS — F32A Depression, unspecified: Secondary | ICD-10-CM

## 2020-01-24 DIAGNOSIS — F329 Major depressive disorder, single episode, unspecified: Secondary | ICD-10-CM

## 2020-01-24 MED ORDER — GABAPENTIN 100 MG PO CAPS: 200.0000 mg | ORAL_CAPSULE | Freq: Three times a day (TID) | ORAL | 3 refills | Status: DC

## 2020-01-24 MED ORDER — FLUOXETINE HCL 40 MG PO CAPS: 40.0000 mg | ORAL_CAPSULE | Freq: Every day | ORAL | 0 refills | Status: DC

## 2020-01-26 DIAGNOSIS — H9190 Unspecified hearing loss, unspecified ear: Secondary | ICD-10-CM | POA: Insufficient documentation

## 2020-01-26 NOTE — Assessment & Plan Note (Addendum)
No SI/HI, patient is still dealing with depressive symptoms and difficulty with anhedonia.  He was advised to consider starting therapy which he said no to at this time.    We will increase fluoxetine to 40 mg from 20, discussed positive negative symptoms, patient, back in 2 weeks and discuss

## 2020-01-26 NOTE — Assessment & Plan Note (Signed)
Patient feels the gabapentin has been working well, would like to get a slightly higher dose, has had no negative symptoms  We will increase her gabapentin 100 mg 3 times daily to 200 mg 3 times daily
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
## 2020-02-15 ENCOUNTER — Other Ambulatory Visit: Payer: Self-pay

## 2020-02-15 ENCOUNTER — Encounter (INDEPENDENT_AMBULATORY_CARE_PROVIDER_SITE_OTHER): Payer: Self-pay | Admitting: Otolaryngology

## 2020-02-15 ENCOUNTER — Ambulatory Visit (INDEPENDENT_AMBULATORY_CARE_PROVIDER_SITE_OTHER): Payer: Self-pay | Admitting: Otolaryngology

## 2020-02-15 VITALS — Temp 98.1°F

## 2020-02-15 DIAGNOSIS — H903 Sensorineural hearing loss, bilateral: Secondary | ICD-10-CM

## 2020-02-15 DIAGNOSIS — J310 Chronic rhinitis: Secondary | ICD-10-CM

## 2020-02-15 DIAGNOSIS — H6121 Impacted cerumen, right ear: Secondary | ICD-10-CM

## 2020-02-15 NOTE — Progress Notes (Signed)
HPI: Eddie [name] is a 44 y.o. male who presents for evaluation of his hearing.  He has previously been seen at [facility] and had a hearing test with them which I do not have available in the office today.  He was told they had a hole in his left ear. He relates that he has had recurrent ear infections whenever he gets a cold or congestion in his head.  He has been prescribed Flonase previously but never used it.  He notices decreased hearing.  Past Medical History:  Diagnosis Date  . Depression   . Encounter to establish care 10/15/2017   Past Surgical History:  Procedure Laterality Date  . HERNIA REPAIR    . SPLENECTOMY, TOTAL     Social History   Socioeconomic History  . Marital status: Married    Spouse name: Not on file  . Number of children: Not on file  . Years of education: Not on file  . Highest education level: Not on file  Occupational History  . Not on file  Tobacco Use  . Smoking status: Never Smoker  . Smokeless tobacco: Never Used  Substance and Sexual Activity  . Alcohol use: Never  . Drug use: Never  . Sexual activity: Not on file  Other Topics Concern  . Not on file  Social History Narrative  . Not on file   Social Determinants of Health   Financial Resource Strain:   . Difficulty of Paying Living Expenses:   Food Insecurity:   . Worried About Charity fundraiser in the Last Year:   . Arboriculturist in the Last Year:   Transportation Needs:   . Film/video editor (Medical):   Marland Kitchen Lack of Transportation (Non-Medical):   Physical Activity:   . Days of Exercise per Week:   . Minutes of Exercise per Session:   Stress:   . Feeling of Stress :   Social Connections:   . Frequency of Communication with Friends and Family:   . Frequency of Social Gatherings with Friends and Family:   . Attends Religious Services:   . Active Member of Clubs or Organizations:   . Attends Archivist Meetings:   Marland Kitchen Marital Status:    Family History   Problem Relation Age of Onset  . Diabetes Mother   . Hypertension Mother   . Diabetes Father   . Hypertension Father    No Known Allergies Prior to Admission medications   Medication Sig Start Date End Date Taking? Authorizing Provider  Cetirizine HCl 10 MG CAPS Take 1 capsule (10 mg total) by mouth daily. 10/24/19  Yes Wieters, Hallie C, PA-C  FLUoxetine (PROZAC) 40 MG capsule Take 1 capsule (40 mg total) by mouth daily. 01/24/20  Yes Bland, Scott, DO  gabapentin (NEURONTIN) 100 MG capsule Take 2 capsules (200 mg total) by mouth 3 (three) times daily. 01/24/20  Yes Bland, Scott, DO  fluticasone (FLONASE) 50 MCG/ACT nasal spray Place 2 sprays into both nostrils daily. Patient not taking: Reported on 02/15/2020 10/02/19   Nuala Alpha, DO     Positive ROS: Otherwise negative  All other systems have been reviewed and were otherwise negative with the exception of those mentioned in the HPI and as above.  Physical Exam: Constitutional: Alert, well-appearing, no acute distress Ears: External ears without lesions or tenderness.  He has wax buildup in the right ear partially obstructing visualization of the TM that was removed in the office using suction and  forceps.  The TM itself was clear.  Left ear canal was clear with a clear left TM.  Did not visualize any TM perforation.  On hearing screening with a 512 1024 tuning fork he has mild hearing loss which is slightly worse on the left side.  AC was greater than BC bilaterally. Nasal: External nose without lesions. Septum with mild deformity and moderate rhinitis.  No signs of infection.  Both middle meatus regions were clear.. Clear nasal passages Oral: Lips and gums without lesions. Tongue and palate mucosa without lesions. Posterior oropharynx clear. Neck: No palpable adenopathy or masses Respiratory: Breathing comfortably  Skin: No facial/neck lesions or rash noted.  Cerumen impaction removal  Date/Time: 02/15/2020 5:59 PM Performed  by: Rozetta Nunnery, MD Authorized by: Rozetta Nunnery, MD   Consent:    Consent obtained:  Verbal   Consent given by:  Patient   Risks discussed:  Pain and bleeding Procedure details:    Location:  L ear   Procedure type: suction and forceps   Post-procedure details:    Inspection:  TM intact and canal normal   Hearing quality:  Improved   Patient tolerance of procedure:  Tolerated well, no immediate complications Comments:     TMs are clear bilaterally.    Assessment: Chronic rhinitis. History of recurrent ear problems. 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  Plan: Prescribed Nasacort 2 sprays each nostril at night as this will help some with nasal congestion as well as any eustachian tube dysfunction he may be experiencing.  As apparently he relates has had frequent ear infections although TMs are clear today bilaterally.  [name], MD

## 2020-07-11 ENCOUNTER — Encounter: Payer: Self-pay | Admitting: Family Medicine

## 2020-07-11 ENCOUNTER — Ambulatory Visit: Payer: Self-pay | Attending: Family Medicine | Admitting: Family Medicine

## 2020-07-11 ENCOUNTER — Other Ambulatory Visit: Payer: Self-pay | Admitting: Family Medicine

## 2020-07-11 ENCOUNTER — Other Ambulatory Visit: Payer: Self-pay

## 2020-07-11 VITALS — BP 158/104 | HR 77 | Temp 97.0°F | Ht 64.61 in | Wt 240.0 lb

## 2020-07-11 DIAGNOSIS — M7731 Calcaneal spur, right foot: Secondary | ICD-10-CM

## 2020-07-11 DIAGNOSIS — J309 Allergic rhinitis, unspecified: Secondary | ICD-10-CM

## 2020-07-11 DIAGNOSIS — R030 Elevated blood-pressure reading, without diagnosis of hypertension: Secondary | ICD-10-CM

## 2020-07-11 DIAGNOSIS — G629 Polyneuropathy, unspecified: Secondary | ICD-10-CM

## 2020-07-11 DIAGNOSIS — R300 Dysuria: Secondary | ICD-10-CM

## 2020-07-11 DIAGNOSIS — F32A Depression, unspecified: Secondary | ICD-10-CM

## 2020-07-11 DIAGNOSIS — F419 Anxiety disorder, unspecified: Secondary | ICD-10-CM

## 2020-07-11 LAB — POCT URINALYSIS DIP (CLINITEK)
Bilirubin, UA: NEGATIVE
Glucose, UA: NEGATIVE mg/dL
Ketones, POC UA: NEGATIVE mg/dL
Leukocytes, UA: NEGATIVE
Nitrite, UA: NEGATIVE
POC PROTEIN,UA: NEGATIVE
Spec Grav, UA: 1.02
Urobilinogen, UA: 0.2 U/dL
pH, UA: 5.5

## 2020-07-11 MED ORDER — TRAMADOL HCL 50 MG PO TABS: 50.0000 mg | ORAL_TABLET | Freq: Two times a day (BID) | ORAL | 0 refills | Status: DC | PRN

## 2020-07-11 MED ORDER — GABAPENTIN 300 MG PO CAPS: 300.0000 mg | ORAL_CAPSULE | Freq: Three times a day (TID) | ORAL | 3 refills | Status: DC

## 2020-07-11 MED ORDER — FLUTICASONE PROPIONATE 50 MCG/ACT NA SUSP: 2.0000 | Freq: Every day | NASAL | 6 refills | Status: DC

## 2020-07-11 MED ORDER — HYDROXYZINE HCL 25 MG PO TABS: 25.0000 mg | ORAL_TABLET | Freq: Three times a day (TID) | ORAL | 2 refills | Status: DC | PRN

## 2020-07-11 MED ORDER — CETIRIZINE HCL 10 MG PO CAPS: 10.0000 mg | ORAL_CAPSULE | Freq: Every day | ORAL | 11 refills | Status: DC

## 2020-07-11 NOTE — Progress Notes (Signed)
New Patient Office Visit  Subjective:  Patient ID: Eddie [name], male    DOB: Mar 18, 1976  Age: 44 y.o. MRN: [id number]  CC:  Chief Complaint  Patient presents with  . Establish Care    HPI Eddie [name], 44 year old male, new to the practice, who complains of ongoing issues with anxiety and depression as well as chronic right heel pain due to heel spur.  He reports that he previously had surgery for removal of a heel spur on the left foot.  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  Patient is accompanied at today's visit by his wife who states that in the past, patient was on tramadol which helped with his pain and he also previously received prescriptions for Xanax and phentermine for weight loss from her prior doctor but with the doctor that patient was most recently seen, the Xanax, tramadol and phentermine were not prescribed.  Patient is also status post COVID-19 infection earlier this year and patient has had continued fatigue since that time.          Wife reports that patient has a history of agoraphobia in addition to anxiety.  Patient has been depressed over inability to find work recently due to the COVID-19 pandemic.  Patient also has conflicts with their adult son for whom they are providing financial assistance.  Wife reports that patient needs higher dose of gabapentin as he was promised a prescription for 300 mg but only received 100 mg.  Per wife, the gabapentin is for nerve pain but neither wife nor patient to clarify what type of nerve pain the patient is having.  Wife and husband also state that patient is no longer taking fluoxetine or never started the fluoxetine to help with his anxiety.            Review of systems, patient has noticed some dysuria with urination.  He denies any frequency or urgency.  He does report that he drinks about 8  bottles of water per day while he is at work.  Patient also with chronic issues with allergic rhinitis for which she would like refill of Flonase and cetirizine.  His wife also reports that patient has had vision issues with migraine headaches, especially when he is upset.  Past Medical History:  Diagnosis Date  . Depression   . Encounter to establish care 10/15/2017    Past Surgical History:  Procedure Laterality Date  . HERNIA REPAIR    . SPLENECTOMY, TOTAL      Family History  Problem Relation Age of Onset  . Diabetes Mother   . Hypertension Mother   . Diabetes Father   . Hypertension Father     Social History   Socioeconomic History  . Marital status: Married    Spouse name: Not on file  . Number of children: Not on file  . Years of education: Not on file  . Highest education level: Not on file  Occupational History  . Not on file  Tobacco Use  . Smoking status: Never Smoker  . Smokeless tobacco: Never Used  Substance and Sexual Activity  . Alcohol use: Never  . Drug use: Never  . Sexual activity: Not on file  Other Topics Concern  . Not on file  Social History Narrative  . Not on file   Social Determinants  of Health   Financial Resource Strain:   . Difficulty of Paying Living Expenses: Not on file  Food Insecurity:   . Worried About Charity fundraiser in the Last Year: Not on file  . Ran Out of Food in the Last Year: Not on file  Transportation Needs:   . Lack of Transportation (Medical): Not on file  . Lack of Transportation (Non-Medical): Not on file  Physical Activity:   . Days of Exercise per Week: Not on file  . Minutes of Exercise per Session: Not on file  Stress:   . Feeling of Stress : Not on file  Social Connections:   . Frequency of Communication with Friends and Family: Not on file  . Frequency of Social Gatherings with Friends and Family: Not on file  . Attends Religious Services: Not on file  . Active Member of Clubs or Organizations:  Not on file  . Attends Archivist Meetings: Not on file  . Marital Status: Not on file  Intimate Partner Violence:   . Fear of Current or Ex-Partner: Not on file  . Emotionally Abused: Not on file  . Physically Abused: Not on file  . Sexually Abused: Not on file    ROS Review of Systems  Constitutional: Positive for fatigue. Negative for chills and fever.  HENT: Positive for congestion. Negative for sore throat and trouble swallowing.   Eyes: Negative for photophobia and visual disturbance.  Respiratory: Negative for cough and shortness of breath.   Gastrointestinal: Negative for abdominal pain, blood in stool, constipation, diarrhea and nausea.  Endocrine: Negative for polydipsia, polyphagia and polyuria.  Genitourinary: Positive for dysuria. Negative for frequency.  Musculoskeletal: Positive for arthralgias, back pain and gait problem.  Skin: Negative for rash and wound.  Neurological: Positive for headaches. Negative for dizziness and light-headedness.  Hematological: Negative for adenopathy. Does not bruise/bleed easily.  Psychiatric/Behavioral: Negative for suicidal ideas. The patient is nervous/anxious.     Objective:   Today's Vitals: BP (!) 160/104 (BP Location: Left Arm, Patient Position: Sitting)   Pulse 77   Temp (!) 97 F (36.1 C)   Ht 5' 4.61" (1.641 m)   Wt 240 lb (108.9 kg)   SpO2 100%   BMI 40.43 kg/m   Physical Exam Vitals and nursing note reviewed.  Constitutional:      General: He is not in acute distress.    Appearance: Normal appearance. He is obese.     Comments: Well-nourished well-developed overweight for height male in no acute distress.  Patient is accompanied by his wife who does most of the talking for the patient.  Cardiovascular:     Rate and Rhythm: Normal rate and regular rhythm.  Pulmonary:     Effort: Pulmonary effort is normal.     Breath sounds: Normal breath sounds.  Abdominal:     Palpations: Abdomen is soft.      Tenderness: There is no abdominal tenderness. There is no right CVA tenderness, left CVA tenderness, guarding or rebound.  Musculoskeletal:        General: Tenderness (right bottom of mid-heel) present.     Cervical back: Normal range of motion and neck supple. No rigidity or tenderness.     Right lower leg: No edema.     Left lower leg: No edema.  Lymphadenopathy:     Cervical: No cervical adenopathy.  Skin:    General: Skin is warm and dry.     Comments: Patient with a small portion of  right great toenail which appears to be re-growing after prior removal- no signs of infection/inflammation  Neurological:     General: No focal deficit present.     Mental Status: He is alert and oriented to person, place, and time.  Psychiatric:        Behavior: Behavior normal.     Comments: Appears anxious     Assessment & Plan:  1. Elevated blood pressure reading Patient's blood pressure was highly elevated at today's visit and was checked x2.  Per patient, he does not like taking a lot of medications therefore he does not wish to be on medication for blood pressure.  Wife reports the patient has been having " migraine" headaches and I discussed with patient and wife that his headaches may actually be related to his elevated blood pressure.  Patient has been asked to return to clinic to meet with the clinical pharmacist in the next 2 to 3 weeks for blood pressure recheck.  2. Heel spur, right Discussed with patient and wife that long-term pain medication is not prescribed at this office but that patient will be given a short-term supply of tramadol to help with his foot pain related to right heel spur.  Patient is also being referred to podiatry for further evaluation and treatment. - traMADol (ULTRAM) 50 MG tablet; Take 1 tablet (50 mg total) by mouth every 12 (twelve) hours as needed.  Dispense: 60 tablet; Refill: 0 - Ambulatory referral to Podiatry  3. Anxiety and depression Discussed with  patient and wife that alprazolam is a controlled substance and would not be prescribed for long-term treatment of anxiety.  Prescription provided for hydroxyzine that patient can take as needed.  He is additionally being referred to social work for help with getting established with mental health regarding his anxiety and depression.  Patient had been prescribed fluoxetine by prior provider but states that he is not taking this medication. - Ambulatory referral to Social Work - hydrOXYzine (ATARAX/VISTARIL) 25 MG tablet; Take 1 tablet (25 mg total) by mouth 3 (three) times daily as needed for anxiety.  Dispense: 30 tablet; Refill: 2  4. Neuropathy Prescription provided for gabapentin 300 mg 3 times daily as this medication can also help with anxiety and depression.  Patient was not clear on the actual location of nerve type pain for which he is requesting the 300 mg gabapentin. - gabapentin (NEURONTIN) 300 MG capsule; Take 1 capsule (300 mg total) by mouth 3 (three) times daily.  Dispense: 90 capsule; Refill: 3  5. Dysuria Patient with complaint of dysuria and urinalysis and urine culture will be obtained at today's visit. - POCT URINALYSIS DIP (CLINITEK) - Urine Culture  6. Allergic rhinitis, unspecified seasonality, unspecified trigger Patient reports longstanding issues with allergic rhinitis and refills provided for cetirizine and Flonase. - Cetirizine HCl 10 MG CAPS; Take 1 capsule (10 mg total) by mouth at bedtime. For nasal congestion  Dispense: 30 capsule; Refill: 11 - fluticasone (FLONASE) 50 MCG/ACT nasal spray; Place 2 sprays into both nostrils daily.  Dispense: 16 g; Refill: 6   Outpatient Encounter Medications as of 07/11/2020  Medication Sig  . Cetirizine HCl 10 MG CAPS Take 1 capsule (10 mg total) by mouth daily.  Marland Kitchen FLUoxetine (PROZAC) 40 MG capsule Take 1 capsule (40 mg total) by mouth daily.  Marland Kitchen gabapentin (NEURONTIN) 100 MG capsule Take 2 capsules (200 mg total) by mouth 3  (three) times daily.  . fluticasone (FLONASE) 50 MCG/ACT nasal spray Place 2  sprays into both nostrils daily. (Patient not taking: Reported on 02/15/2020)   No facility-administered encounter medications on file as of 07/11/2020.    Follow-up: Return in about 5 weeks (around 08/15/2020) for chronic issues- sooner if needed; 2-3 weeks BP recheck with Luke/CPP.  Antony Blackbird, MD

## 2020-07-11 NOTE — Progress Notes (Signed)
DEPRESSED BOTH FEET HEEL SPURS  WANTS GABAPENTIN INCREASE DOSAGE

## 2020-07-13 LAB — URINE CULTURE: Organism ID, Bacteria: NO GROWTH

## 2020-07-17 ENCOUNTER — Ambulatory Visit: Payer: Self-pay

## 2020-07-18 ENCOUNTER — Telehealth: Payer: Self-pay | Admitting: Licensed Clinical Social Worker

## 2020-07-18 NOTE — Telephone Encounter (Signed)
LCSW placed call to patient regarding [facility] referral. LCSW left message requesting a return call.

## 2020-07-22 NOTE — Telephone Encounter (Signed)
Patient wife Eddie [name] called in to speak to [place] in reference to her husband. She would like a call back at Ph# [phone]

## 2020-07-24 ENCOUNTER — Other Ambulatory Visit: Payer: Self-pay

## 2020-07-24 ENCOUNTER — Telehealth: Payer: Self-pay | Admitting: Licensed Clinical Social Worker

## 2020-07-24 ENCOUNTER — Ambulatory Visit: Payer: Self-pay | Attending: Family Medicine

## 2020-07-24 NOTE — Telephone Encounter (Signed)
Return call placed to patient. LCSW left message requesting a return call.  

## 2020-07-24 NOTE — Telephone Encounter (Signed)
Incoming call received from patient's wife, Gabrielle Dare. She shared frustrations of limited assistance for spouse. He has a scheduled appt today with Financial Counseling. Mrs. Eugenio Hoes was unable to come to clinic early to meet with LCSW; however, agreed to allow LCSW or MSW Intern to complete face to face introduction during visit with Development worker, community. No additional concerns noted.

## 2020-07-24 NOTE — Telephone Encounter (Signed)
Completed Legal Aid submitted via e-mail, per pt request.

## 2020-07-25 ENCOUNTER — Encounter: Payer: Self-pay | Admitting: Pharmacist

## 2020-07-25 ENCOUNTER — Ambulatory Visit: Payer: Self-pay | Attending: Family Medicine | Admitting: Pharmacist

## 2020-07-25 VITALS — BP 132/84 | HR 78

## 2020-07-25 DIAGNOSIS — R030 Elevated blood-pressure reading, without diagnosis of hypertension: Secondary | ICD-10-CM

## 2020-07-25 NOTE — Progress Notes (Signed)
   S:    PCP: Dr. Chapman Fitch  Patient arrives in good spirits. Presents to the clinic for hypertension evaluation, counseling, and management. Patient was referred and last seen by Primary Care Provider on 07/11/2020. BP was elevated at that visit, however, pt does not have a formal dx of hypertension on his chart.   Medication adherence: does not take antihypertensive medications.  Current BP Medications include:  None   Antihypertensives tried in the past include: none   Dietary habits include:  - Does not limit salt - Drinks coffee and soda daily  Exercise habits include:  - Limited since Covid dx in Dec, 2020.  - He is able to walk his dog daily  Family / Social history:  - FHx: DM, HTN - Tobacco: never smoker  - Alcohol: denies use   O:  Vitals:   07/25/20 0944  BP: 132/84  Pulse: 78    Home BP readings:  - Wife with him today who is also a nurse  - Recalls 1 BP reading at home in the last 3 days; 124/84  Last 3 Office BP readings: BP Readings from Last 3 Encounters:  07/25/20 132/84  07/11/20 (!) 158/104  01/24/20 120/78    BMET    Component Value Date/Time   NA 141 07/21/2010 1132   K 4.0 07/21/2010 1132   CL 105 07/21/2010 1132   CO2 27 07/21/2010 1132   GLUCOSE 92 07/21/2010 1132   BUN 11 07/21/2010 1132   CREATININE 0.64 07/21/2010 1132   CALCIUM 9.4 07/21/2010 1132   GFRNONAA >60 07/21/2010 1132   GFRAA  07/21/2010 1132    >60        The eGFR has been calculated using the MDRD equation. This calculation has not been validated in all clinical situations. eGFR's persistently <60 mL/min signify possible Chronic Kidney Disease.   Renal function: CrCl cannot be calculated (Patient's most recent lab result is older than the maximum 21 days allowed.).  Clinical ASCVD: No  The ASCVD Risk score Mikey Bussing DC Jr., et al., 2013) failed to calculate for the following reasons:   Cannot find a previous HDL lab   Cannot find a previous total cholesterol  lab  A/P: Hypertension undiagnosed. BP is elevated today but better compared to last OV. He also has a BP reading from home that is not bad. I think it is reasonable to focus on lifestyle improvement and return for reassessment in 1 month.   -Counseled on lifestyle modifications for blood pressure control including reduced dietary sodium, increased exercise, adequate sleep.  Results reviewed and written information provided.   Total time in face-to-face counseling 30 minutes.   F/U Clinic Visit with PCP.   Benard Halsted, PharmD, [place] [phone]

## 2020-08-05 ENCOUNTER — Telehealth: Payer: Self-pay | Admitting: Licensed Clinical Social Worker

## 2020-08-05 NOTE — Telephone Encounter (Signed)
Incoming call received from pt's spouse. She received a message from Legal Aid and plans on returning their call to address housing concerns.   LCSW explained to Mrs. Eddie [name] that her spouse's follow up appointment was cancelled due to the Provider no longer being employed at [employer]. LCSW notified [facility] staff to call patient to re-schedule appointment.   During call, Eddie [name] with Legal Aid called patient. LCSW ended call so that family could complete intake. No additional concerns noted.

## 2020-08-05 NOTE — Telephone Encounter (Signed)
Received incoming call from Mickel Baas, with Legal Aid. A file has been opened for the family and an e-mail has been sent to housing department for immediate assistance. No additional concerns noted.

## 2020-08-06 ENCOUNTER — Other Ambulatory Visit: Payer: Self-pay | Admitting: Family Medicine

## 2020-08-06 DIAGNOSIS — M7731 Calcaneal spur, right foot: Secondary | ICD-10-CM

## 2020-08-06 NOTE — Telephone Encounter (Signed)
Requested medication (s) are due for refill today: yes  Requested medication (s) are on the active medication list: yes  Last refill:  07/11/20 #60  Future visit scheduled: yes  Notes to clinic:  Please review for refill. Refill not delegated per protocol.    Requested Prescriptions  Pending Prescriptions Disp Refills   traMADol (ULTRAM) 50 MG tablet [Pharmacy Med Name: traMADol HCL 50 MG TABS 50 Tablet] 60 tablet 0    Sig: Take 1 tablet (50 mg total) by mouth every 12 (twelve) hours as needed.      Not Delegated - Analgesics:  Opioid Agonists Failed - 08/06/2020 12:04 PM      Failed - This refill cannot be delegated      Failed - Urine Drug Screen completed in last 360 days      Passed - Valid encounter within last 6 months    Recent Outpatient Visits           1 week ago Elevated blood pressure reading   [place], Jarome Matin, RPH-CPP   3 weeks ago Elevated blood pressure reading   [place], MD       Future Appointments             In 1 month Joya Gaskins Burnett Harry, MD [place]

## 2020-08-09 ENCOUNTER — Ambulatory Visit: Payer: Self-pay | Admitting: Family Medicine

## 2020-08-09 ENCOUNTER — Other Ambulatory Visit: Payer: Self-pay | Admitting: Family Medicine

## 2020-08-09 DIAGNOSIS — M7731 Calcaneal spur, right foot: Secondary | ICD-10-CM

## 2020-08-09 NOTE — Telephone Encounter (Signed)
Requested medication (s) are due for refill today:   Provider to determine  Requested medication (s) are on the active medication list:   Yes  Future visit scheduled:   Yes in 1 mo. With Dr. Joya Gaskins.   Was pt of Dr. Chapman Fitch   Last ordered: 09/11/2019 #60, 0 refills  Non delegated refill   Requested Prescriptions  Pending Prescriptions Disp Refills   traMADol (ULTRAM) 50 MG tablet 60 tablet 0    Sig: Take 1 tablet (50 mg total) by mouth every 12 (twelve) hours as needed.      Not Delegated - Analgesics:  Opioid Agonists Failed - 08/09/2020  9:13 AM      Failed - This refill cannot be delegated      Failed - Urine Drug Screen completed in last 360 days      Passed - Valid encounter within last 6 months    Recent Outpatient Visits           2 weeks ago Elevated blood pressure reading   [place], Jarome Matin, RPH-CPP   4 weeks ago Elevated blood pressure reading   [place], MD       Future Appointments             In 1 month Joya Gaskins Burnett Harry, MD [place]

## 2020-08-09 NOTE — Telephone Encounter (Signed)
Medication: traMADol (ULTRAM) 50 MG tablet [[id number]]   Has the patient contacted their pharmacy? YES  (Agent: If no, request that the patient contact the pharmacy for the refill.) (Agent: If yes, when and what did the pharmacy advise?)  Preferred Pharmacy (with phone number or street name): [facility], [place] [address] Phone: [phone] Fax: [phone] Hours: Not open 24 hours    Agent: Please be advised that RX refills may take up to 3 business days. We ask that you follow-up with your pharmacy.

## 2020-08-13 ENCOUNTER — Telehealth: Payer: Self-pay | Admitting: Family Medicine

## 2020-08-13 NOTE — Telephone Encounter (Signed)
Pt states that needs medication tramadol sooner than sch appt , in pain wants a cb at [phone]

## 2020-08-13 NOTE — Telephone Encounter (Signed)
Pt was sent a letter from financial dept. Inform them, that the application they submitted was incomplete, since they were missing some documentation at the time of the appointment, Pt need to reschedule and resubmit all new papers and application for [employer] and [place], P.S. old documents has been sent back by mail to the Pt and Pt. need to make a new appt. 

## 2020-08-19 ENCOUNTER — Other Ambulatory Visit: Payer: Self-pay | Admitting: Family Medicine

## 2020-08-19 DIAGNOSIS — M7731 Calcaneal spur, right foot: Secondary | ICD-10-CM

## 2020-08-19 NOTE — Telephone Encounter (Signed)
Requested medication (s) are due for refill today - no  Requested medication (s) are on the active medication list -yes  Future visit scheduled -yes  Last refill: 07/11/20  Notes to clinic: Requesting non delegated Rx- already refused x 2  Requested Prescriptions  Pending Prescriptions Disp Refills   traMADol (ULTRAM) 50 MG tablet [Pharmacy Med Name: traMADol HCL 50 MG TABS 50 Tablet] 60 tablet 0    Sig: Take 1 tablet (50 mg total) by mouth every 12 (twelve) hours as needed.      Not Delegated - Analgesics:  Opioid Agonists Failed - 08/19/2020  4:06 PM      Failed - This refill cannot be delegated      Failed - Urine Drug Screen completed in last 360 days      Passed - Valid encounter within last 6 months    Recent Outpatient Visits           3 weeks ago Elevated blood pressure reading   [place], Jarome Matin, RPH-CPP   1 month ago Elevated blood pressure reading   [place], MD       Future Appointments             In 1 month Elsie Stain, MD [place]                 Requested Prescriptions  Pending Prescriptions Disp Refills   traMADol (ULTRAM) 50 MG tablet [Pharmacy Med Name: traMADol HCL 50 MG TABS 50 Tablet] 60 tablet 0    Sig: Take 1 tablet (50 mg total) by mouth every 12 (twelve) hours as needed.      Not Delegated - Analgesics:  Opioid Agonists Failed - 08/19/2020  4:06 PM      Failed - This refill cannot be delegated      Failed - Urine Drug Screen completed in last 360 days      Passed - Valid encounter within last 6 months    Recent Outpatient Visits           3 weeks ago Elevated blood pressure reading   [place], Jarome Matin, RPH-CPP   1 month ago Elevated blood pressure reading   [place], MD       Future Appointments              In 1 month Joya Gaskins Burnett Harry, MD [place]

## 2020-08-21 ENCOUNTER — Other Ambulatory Visit: Payer: Self-pay | Admitting: Family Medicine

## 2020-08-21 ENCOUNTER — Ambulatory Visit: Payer: Self-pay | Attending: Family Medicine | Admitting: Licensed Clinical Social Worker

## 2020-08-21 ENCOUNTER — Other Ambulatory Visit: Payer: Self-pay

## 2020-08-21 DIAGNOSIS — F32A Depression, unspecified: Secondary | ICD-10-CM

## 2020-08-21 DIAGNOSIS — F411 Generalized anxiety disorder: Secondary | ICD-10-CM

## 2020-08-21 NOTE — Telephone Encounter (Signed)
Requested medication (s) are due for refill today - unsure  Requested medication (s) are on the active medication list -yes  Future visit scheduled -yes  Last refill: 09/11/19 #30 2 RF  Notes to clinic: Patient has not seen any other providers in practice- sent for review of request.  Requested Prescriptions  Pending Prescriptions Disp Refills   hydrOXYzine (ATARAX/VISTARIL) 25 MG tablet [Pharmacy Med Name: hydrOXYzine HCL 25 MG TABS 25 Tablet] 30 tablet 2    Sig: Take 1 tablet (25 mg total) by mouth 3 (three) times daily as needed for anxiety.      Ear, Nose, and Throat:  Antihistamines Passed - 08/21/2020 10:46 AM      Passed - Valid encounter within last 12 months    Recent Outpatient Visits           3 weeks ago Elevated blood pressure reading   [place], Jarome Matin, RPH-CPP   1 month ago Elevated blood pressure reading   [place] Antony Blackbird, MD       Future Appointments             In 5 days [name], MD [facility], [facility]   In 4 weeks Elsie Stain, MD [place]                 Requested Prescriptions  Pending Prescriptions Disp Refills   hydrOXYzine (ATARAX/VISTARIL) 25 MG tablet [Pharmacy Med Name: hydrOXYzine HCL 25 MG TABS 25 Tablet] 30 tablet 2    Sig: Take 1 tablet (25 mg total) by mouth 3 (three) times daily as needed for anxiety.      Ear, Nose, and Throat:  Antihistamines Passed - 08/21/2020 10:46 AM      Passed - Valid encounter within last 12 months    Recent Outpatient Visits           3 weeks ago Elevated blood pressure reading   [place], Jarome Matin, RPH-CPP   1 month ago Elevated blood pressure reading   [place], MD       Future Appointments             In 5 days [name], MD [facility]   In 4 weeks Elsie Stain, MD [place]

## 2020-08-22 ENCOUNTER — Other Ambulatory Visit: Payer: Self-pay | Admitting: Family Medicine

## 2020-08-22 NOTE — BH Specialist Note (Signed)
Follow up call placed to patient. Pt's spouse, [name] provided majority of hx. Mrs. Eugenio Hoes reports that pt is out of medications and is in desperate need of refill for hydroxizine due to ongoing anxiety symptoms. Family voiced frustration that the medication lasted "only 10 days" and family has to pay for another refill. They have been informed to re-apply for financial assistance through [place] to obtain blue card.  LCSW spoke with [facility] staff, Claiborne Billings, who shared only one medication is on the [facility]) listing. Family is requesting an updated prescription for 30 day supply of hydroxizine to bridge pt to scheduled appointment for 09/18/20 with Dr. Joya Gaskins.  LCSW strongly encouraged family to initiate medication management and therapy at [facility]. Contact information and walk-in hours provided.

## 2020-08-26 ENCOUNTER — Encounter: Payer: Self-pay | Admitting: Family Medicine

## 2020-08-26 ENCOUNTER — Ambulatory Visit (INDEPENDENT_AMBULATORY_CARE_PROVIDER_SITE_OTHER): Payer: Self-pay | Admitting: Family Medicine

## 2020-08-26 ENCOUNTER — Other Ambulatory Visit: Payer: Self-pay

## 2020-08-26 VITALS — BP 124/86 | HR 83 | Ht 65.0 in | Wt 243.8 lb

## 2020-08-26 DIAGNOSIS — G8929 Other chronic pain: Secondary | ICD-10-CM | POA: Insufficient documentation

## 2020-08-26 DIAGNOSIS — Z596 Low income: Secondary | ICD-10-CM

## 2020-08-26 DIAGNOSIS — G629 Polyneuropathy, unspecified: Secondary | ICD-10-CM

## 2020-08-26 DIAGNOSIS — J309 Allergic rhinitis, unspecified: Secondary | ICD-10-CM

## 2020-08-26 DIAGNOSIS — F32A Depression, unspecified: Secondary | ICD-10-CM

## 2020-08-26 DIAGNOSIS — Z23 Encounter for immunization: Secondary | ICD-10-CM

## 2020-08-26 DIAGNOSIS — M79671 Pain in right foot: Secondary | ICD-10-CM

## 2020-08-26 DIAGNOSIS — H547 Unspecified visual loss: Secondary | ICD-10-CM | POA: Insufficient documentation

## 2020-08-26 MED ORDER — DICLOFENAC SODIUM 1 % EX GEL: 2.0000 g | Freq: Four times a day (QID) | CUTANEOUS | 0 refills | Status: DC | PRN

## 2020-08-26 MED ORDER — FLUOXETINE HCL 20 MG PO TABS: 20.0000 mg | ORAL_TABLET | Freq: Every day | ORAL | 3 refills | Status: DC

## 2020-08-26 MED ORDER — FLUTICASONE PROPIONATE 50 MCG/ACT NA SUSP: 2.0000 | Freq: Every day | NASAL | 6 refills | Status: DC

## 2020-08-26 MED ORDER — CETIRIZINE HCL 10 MG PO CAPS: 10.0000 mg | ORAL_CAPSULE | Freq: Every day | ORAL | 11 refills | Status: DC

## 2020-08-26 MED ORDER — GABAPENTIN 300 MG PO CAPS: 300.0000 mg | ORAL_CAPSULE | Freq: Three times a day (TID) | ORAL | 3 refills | Status: DC

## 2020-08-26 NOTE — Patient Instructions (Signed)
It was nice seeing you today, Eddie [name].  I am changing your hydroxyzine to fluoxetine 20 mg.  You can stop taking the hydroxyzine.  There is an interaction with tramadol with fluoxetine, so instead I am prescribing you a cream called Voltaren gel for your heel.  Please follow-up in 1 month to see how the medications are working.  Stay well, Eddie Button, MD

## 2020-08-26 NOTE — Assessment & Plan Note (Signed)
No SI/HI.  With elements of anxiety.  Will discontinue hydroxyzine and restart fluoxetine 20 mg.

## 2020-08-26 NOTE — Assessment & Plan Note (Addendum)
Reportedly due to a heel spur and previously had to have surgery for removal of a heel spur on his left foot.  Given interaction with fluoxetine (possible risk for serotonin syndrome), will not restart tramadol at this time.  Encourage patient to do Tylenol as needed and try diclofenac gel. - referral to podiatry
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
## 2020-08-26 NOTE — Assessment & Plan Note (Signed)
Reports vision loss ever since he got Covid 1 year ago. Patient to make appointment with optometry.

## 2020-08-27 ENCOUNTER — Telehealth: Payer: Self-pay | Admitting: *Deleted

## 2020-08-27 NOTE — Chronic Care Management (AMB) (Signed)
   Care Management   Outreach Note  08/27/2020 Name: Eddie [name] MRN: [id number] DOB: [birth year]  Eddie [name] is a 44 y.o. year old male who is a primary care patient of [name], MD.. I reached out to [place] by phone today in response to a referral sent by [name]'s PCP, [name], MD. An unsuccessful telephone outreach was attempted today. The patient was referred to the case management team for assistance with care management and care coordination.   Follow Up Plan: A HIPAA compliant phone message was left for the patient providing contact information and requesting a return call. The care management team will reach out to the patient again over the next 7 days. If patient returns call to provider office, please advise to call [place] at [phone].  [place] Management

## 2020-08-28 ENCOUNTER — Ambulatory Visit: Payer: Self-pay

## 2020-09-02 NOTE — Chronic Care Management (AMB) (Signed)
   Care Management   Outreach Note  09/02/2020 Name: Ryan Ogborn MRN: [id number] DOB: [birth year]  [name] Trayon Krantz is a 44 y.o. year old male who is a primary care patient of [name], MD.. I reached out to [place] by phone today in response to a referral sent by Mr. KHARON HIXON Argueta's PCP, [name], MD.  A second unsuccessful telephone outreach was attempted today. The patient was referred to the case management team for assistance with care management and care coordination.   Follow Up Plan: A HIPAA compliant phone message was left for the patient providing contact information and requesting a return call. The care management team will reach out to the patient again over the next 7 days. If patient returns call to provider office, please advise to call [place] at [phone].  [place] Management

## 2020-09-03 NOTE — Chronic Care Management (AMB) (Signed)
  Care Management   Note  09/03/2020 Name: Eddie [name] MRN: [id number] DOB: 1976-03-23  Eddie [name] is a 44 y.o. year old male who is a primary care patient of Littie Deeds, MD. I reached out to [name] by phone today in response to a referral sent by Eddie [name]'s PCP, Littie Deeds, MD.    Eddie [name] was given information about care management services today including:  1. Care management services include personalized support from designated clinical staff supervised by his physician, including individualized plan of care and coordination with other care providers 2. 24/7 contact phone numbers for assistance for urgent and routine care needs. 3. The patient may stop care management services at any time by phone call to the office staff.  Patient agreed to services and verbal consent obtained.   Follow up plan: Telephone appointment with care management team member scheduled for:09/10/2020  [facility] Guide, Embedded Care Coordination [facility]  Care Management  Direct Dial: [phone]

## 2020-09-10 ENCOUNTER — Ambulatory Visit: Payer: Self-pay | Admitting: Licensed Clinical Social Worker

## 2020-09-10 DIAGNOSIS — F32A Depression, unspecified: Secondary | ICD-10-CM

## 2020-09-10 DIAGNOSIS — R4589 Other symptoms and signs involving emotional state: Secondary | ICD-10-CM

## 2020-09-10 NOTE — Patient Instructions (Signed)
  [name]  it was nice speaking with you and your wife today. Please call me directly [phone] if you have questions about the goals we discussed. Goals Addressed            This Visit's Progress   . Begin and Stick with Counseling-Depression       Timeframe:  Short-Term Goal Priority:  High Start Date:        09/10/2020                     Expected End Date:     12/05/2020                  Follow Up Date 09/24/2020   Patient Goals/Self-Care Activities:  . I have placed a referral for you with [facility] [phone]    Why is this important?    Beating depression may take some time.   If you don't feel better right away, don't give up on your treatment plan.      . Find Help in My Community       Timeframe:  Short-Term Goal Priority:  High Start Date:    09/10/2020                         Expected End Date:   12/05/2020                    Follow Up Date 09/24/2020     Patient Goals/Self-Care Activities: Over the next 30 days . Pick up and complete [employer] . Call DSS to increase food stamps after you pick up your last check . Food pick up at [employer] . Call Legal Aid  [phone] . Review list provided and call agencies for help with water bill . Review list for housing options if you decide to move . Call Medication Assistance program to help with Medication [phone]   Why is this important?    Knowing how and where to find help for yourself or family in your neighborhood and community is an important skill.      [name] received Care Management services today:  1. Care Management services include personalized support from designated clinical staff supervised by his physician, including individualized plan of care and coordination with other care providers 2. 24/7 contact [phone] for assistance for urgent and routine care needs. 3. Care Management are voluntary  services and be declined at any time by calling the office.  Patient verbalizes understanding of instructions provided today.    Follow up plan: SW will follow up with patient by phone over the next 2 weeks  Soundra Pilon, LCSW

## 2020-09-10 NOTE — Chronic Care Management (AMB) (Signed)
Care Management   Clinical Social Work InitialNote  09/10/2020 Name: Eddie [name] MRN: [id number] DOB: 04-Nov-1975 Eddie [name] Eddie [name] is a 45 y.o. year old male who recently transferred from [place]. Patient is enrolled in a Managed Medicaid plan: No. The Care Management team was consulted by Dr. Nancy Fetter to assist the patient with [facility] and Medication Assistance .   Engaged with patient and wife by telephone today in response to provider referral for social work case management and/or care coordination services. Most information obtained by wife.  After further assessment several other needs were identified.  See care plan below for details during this encounter.  Follow up Plan:phone appointment scheduled 09/24/2020.  Wife will pick up packet of information at front desk 09/11/2020 Advanced Directives Status:Not addressed in this encounter.     SDOH (Social Determinants of Health) assessments performed: Yes  SDOH Interventions   Flowsheet Row Most Recent Value  SDOH Interventions   Food Insecurity Interventions Other (Comment)  [[facility] food referral]  Financial Strain Interventions Other (Comment)  Housing Interventions Other (Comment)       Patient Care Plan: Clinical Social Work  Problem Identified: Emotional Distress   Goal: Emotional Health Supported /  Over the next 30 days, patient will connect for ongoing counseling and medication management .   Start Date: 09/10/2020  Expected End Date: 12/05/2020  This Visit's Progress: On track  Priority: High  Objective:  PHQ-9 score of 20 is an indication of severe symptoms of depression. Depression screen [facility] 2/9 08/26/2020 07/11/2020 01/24/2020  Decreased Interest 3 3 1   Down, Depressed, Hopeless 3 3 1   PHQ - 2 Score 6 6 2   Altered sleeping 2 3 -  Tired, decreased energy 3 3 -  Change in appetite 3 3 -  Feeling bad or failure about yourself  2 3 -  Trouble concentrating 2 3 -  Moving slowly or  fidgety/restless 2 2 -  Suicidal thoughts 0 0 -  PHQ-9 Score 20 23 -    Assessment, progress and current barriers:  Patient is currently experiencing symptoms of  anxiety and depression, per wife which seems to be exacerbated after being diagnoses with COVID. Anxiety levels increased limiting his ability to work in addition to other chronic medical concerns. Patient is now open and wants to start counseling.  Reports the medication Dr. Nancy Fetter started him on is not working.   Eddie [name] knowledge of where and how to connect for counseling and psychiatry do to not having medical insurance . needs Support, Education, and Care Coordination in order to meet unmet need.  Clinical Interventions:  . Assessed patient's previous treatment, needs, coping skills, current treatment, support system and barriers to care . Reviewed appropriate assessments : PHQ 9 . Discussed several options for long term counseling based on need and uninsured. Assisted patient with narrowing the options down to [facility]  ) . Reviewed mental health medications with patient prescribed by PCP and discussed compliance  . Referral placed [facility] . Other interventions include: Motivational Interviewing, Solution-Focused Strategies, Emotional/Supportive Counseling, and Referred to psychiatrist  . Collaboration with PCP regarding development and update of comprehensive plan of care as evidenced by provider attestation and co-signature . Inter-disciplinary care team collaboration (see longitudinal plan of care) Patient Goals/Self-Care Activities:  . I have placed a referral for you with [facility] [phone]   Follow Up Plan:  SW will follow up with patient by  phone over the next 2 weeks      Problem Identified: Barriers to Treatment   Goal: Barriers to Treatment Identified / Over the next 90 days, patient will  connect with community resources provided   Start  Date: 09/10/2020  Expected End Date: 12/05/2020  This Visit's Progress: On track  Priority: High  Assessment, progress & current barriers:  Patient unable to work and wife lost job this week. Rent is being paid until March. Has no insurance; has not applies for disability; currently receiving food stamps but need additional food resources. Requested information from legal Aide need legal support. Patient needs assistance with community resources and support. Marland Kitchen  acknowledges deficits with meeting this unmet need Clinical Interventions: collaboration with PCP regarding development and update of comprehensive plan of care as evidenced by provider attestation and co-signature . Inter-disciplinary care team collaboration (see longitudinal plan of care) . Assessment of needs, barriers , agencies contacted, as well as how impacting  . Review various resources and discussed options   . Provided patient with information and copy of [employer] application as well as [employer];  o Where to apply for Disability and phone number; [facility] community food resources with [employer] ; contact information for Legal Aid; Resources that may assist with water bill   . Other interventions provided:Solution-Focused Strategies, Emotional/Supportive Counseling, and Problem Solving Patient Goals/Self-Care Activities: Over the next 30 days . Pick up and complete [employer] . Call DSS to increase food stamps after you pick up your last check . Food pick up at [employer] . Call Legal Aid  [phone] . Review list provided and call agencies for help with water bill . Review list for housing options if you decide to move . Call Medication Assistance program to help with Medication [phone] Follow Up Plan: SW will follow up with patient by phone over the next 2 weeks     Outpatient Encounter Medications as of 09/10/2020  Medication Sig  . Cetirizine HCl 10  MG CAPS Take 1 capsule (10 mg total) by mouth at bedtime. For nasal congestion  . diclofenac Sodium (VOLTAREN) 1 % GEL Apply 2 g topically 4 (four) times daily as needed.  Marland Kitchen FLUoxetine (PROZAC) 20 MG tablet Take 1 tablet (20 mg total) by mouth daily.  . fluticasone (FLONASE) 50 MCG/ACT nasal spray Place 2 sprays into both nostrils daily.  Marland Kitchen gabapentin (NEURONTIN) 300 MG capsule Take 1 capsule (300 mg total) by mouth 3 (three) times daily.   No facility-administered encounter medications on file as of 09/10/2020.   Review of patient status, including review of consultants reports, relevant laboratory and other test results, and collaboration with appropriate care team members and the patient's provider was performed as part of comprehensive patient evaluation and provision of chronic care management services.       Information about Care Management services was shared with Mr.  Aedin Jeansonne today including:  1. Care Management services include personalized support from designated clinical staff supervised by his physician, including individualized plan of care and coordination with other care providers 2. Remind patient of 24/7 contact phone numbers to provider's office for assistance with urgent and routine care needs. 3. Care Management services are voluntary and patient may stop at any time .   Patient agreed to services provided today and verbal consent obtained.     Sammuel Hines, LCSW Care Management & Coordination  [facility] / [employer]  [phone] 4:51 PM

## 2020-09-13 ENCOUNTER — Telehealth: Payer: Self-pay | Admitting: Licensed Clinical Social Worker

## 2020-09-13 ENCOUNTER — Ambulatory Visit: Payer: Self-pay | Admitting: Licensed Clinical Social Worker

## 2020-09-13 DIAGNOSIS — Z636 Dependent relative needing care at home: Secondary | ICD-10-CM

## 2020-09-13 DIAGNOSIS — Z139 Encounter for screening, unspecified: Secondary | ICD-10-CM

## 2020-09-13 NOTE — Telephone Encounter (Signed)
Return call placed to pt's wife, Eddie [name] [phone]  Mrs. Eddie [name] shared that pt has an upcoming appointment with [facility] and she has applied for emergency rental/utility assistance through DSS. Family is receiving support from [facility], in addition, to family who reside out of state.   LCSW strongly encouraged Mrs. Eddie [name] to comply with agreed upon treatment plan to promote health and well-being of patient. Pt verbalized understanding, no additional concerns noted.

## 2020-09-13 NOTE — Chronic Care Management (AMB) (Signed)
Care Management   Clinical Social Work Follow UpNote  09/13/2020 Name: Eddie [name] MRN: [id number] DOB: [birth year] Eddie [name] is a 45 y.o. year old male .  Patient is enrolled in a Managed Medicaid plan: No.  Engaged with patient's wife by telephone today in response to provider referral for social work case management and/or care coordination services. See care plan below for details during this encounter.  Follow up Plan: f/u with LCSW in 1 week  Advanced Directives Status:Not addressed in this encounter.     SDOH (Social Determinants of Health) assessments performed: Yes     Patient Care Plan: Clinical Social Work  Problem Identified: Emotional Distress   Goal: Emotional Health Supported /  Over the next 30 days, patient will connect for ongoing counseling and medication management .   Start Date: 09/10/2020  Expected End Date: 12/05/2020  This Visit's Progress: On track  Priority: High  Note:   Objective:  PHQ-9 score of 20 is an indication of severe symptoms of depression. Depression screen [facility] 2/9 08/26/2020 07/11/2020 01/24/2020  Decreased Interest 3 3 1   Down, Depressed, Hopeless 3 3 1   PHQ - 2 Score 6 6 2   Altered sleeping 2 3 -  Tired, decreased energy 3 3 -  Change in appetite 3 3 -  Feeling bad or failure about yourself  2 3 -  Trouble concentrating 2 3 -  Moving slowly or fidgety/restless 2 2 -  Suicidal thoughts 0 0 -  PHQ-9 Score 20 23 -    Assessment, progress and current barriers:  Patient is currently experiencing symptoms of  anxiety and depression, per wife which seems to be exacerbated after being diagnoses with COVID. Anxiety levels increased limiting his ability to work in addition to other chronic medical concerns. Patient is now open and wants to start counseling.  Reports the medication Dr. Nancy Fetter started him on is not working.   [name] knowledge of where and how to connect for counseling and psychiatry do to not having medical  insurance . needs Support, Education, and Care Coordination in order to meet unmet need.  Clinical Interventions:  . Assessed patient's previous treatment, needs, coping skills, current treatment, support system and barriers to care . Reviewed appropriate assessments : PHQ 9 . Discussed several options for long term counseling based on need and uninsured. Assisted patient with narrowing the options down to [facility]  ) . Reviewed mental health medications with patient prescribed by PCP and discussed compliance  . Referral placed [facility] . Other interventions include: Motivational Interviewing, Solution-Focused Strategies, Emotional/Supportive Counseling, and Referred to psychiatrist  . Collaboration with PCP regarding development and update of comprehensive plan of care as evidenced by provider attestation and co-signature . Inter-disciplinary care team collaboration (see longitudinal plan of care) Patient Goals/Self-Care Activities:  . I have placed a referral for you with [facility] [phone]   Follow Up Plan:  SW will follow up with patient by phone over the next 2 weeks      Problem Identified: Barriers to Treatment     Goal: Barriers to Treatment Identified / Over the next 90 days, patient will  connect with community resources provided   Start Date: 09/10/2020  Expected End Date: 12/05/2020  This Visit's Progress: Not on track  Recent Progress: On track  Priority: High  Assessment, progress & current barriers:  As of today wife has not pick up packet left at the front  desk.   Wife reports hot water is off. Needs assistance with getting water turned on.  Has cold water but hot water not working. Wife is flustered and not able to understand why the hot water is not working and not able to get answers from the apartment office. . Patient unable to work and wife lost job this week. Rent is being paid until March. Has  no insurance; has not applies for disability; currently receiving food stamps but need additional food resources. Requested information from legal Aide need legal support. Patient needs assistance with community resources and support. Marland Kitchen  acknowledges deficits with meeting this unmet need Clinical Interventions:  . collaboration with PCP regarding development and update of comprehensive plan of care as evidenced by provider attestation and co-signature . Inter-disciplinary care team collaboration (see longitudinal plan of care) . Assessment of needs, barriers , agencies contacted, as well as how impacting  . Called leasing office [place] [phone] with patient on line to assess what is needed to get hot water on in unit ( left voice message)  . Called leasing office 2 more times  this morning ( no answer) . Review various resources and discussed options   . Provided patient with information and copy of [employer] application as well as [employer] ;  o Where to apply for Disability and phone number; [facility] community food resources with [employer] ; contact information for Legal Aid; Resources that may assist with water bill   . Other interventions provided:Solution-Focused Strategies, Emotional/Supportive Counseling, and Problem Solving Patient Goals/Self-Care Activities: Over the next 30 days . Pick up and complete [employer] . Call DSS to increase food stamps after you pick up your last check . Food pick up at [employer] . Call Legal Aid  [phone] . Review list provided and call agencies for help with water bill . Review list for housing options if you decide to move . Call Medication Assistance program to help with Medication [phone] Follow Up Plan: SW will follow up with patient by phone over the next 2 weeks   Problem Identified: Caregiver Stress   Goal: Caregiver Coping /  Patient's wife will connect for  counseling to reduce or manage symptoms of anxiety and stress   Start Date: 09/13/2020  Expected End Date: 12/05/2020  This Visit's Progress: On track  Priority: High  Assessment, progress and current barriers:  Patient's wife is currently experiencing symptoms of stress, feeling overwhelmed, crying, difficult thinking which seems to be exacerbated by caring for her husband and psychosocial stressors.    . needs Support, Education, and Care Coordination in order to meet unmet need.  Clinical Interventions:  . Assessed thoughts of SI, plan and access to means. Denies SI but reports in crisis mode and does not want it to get to SI . Assessed patient's previous treatment, needs, coping skills, current treatment, support system and barriers to care . Discussed several options for crisis treatment based on need and insurance. Assisted patient with narrowing the options down to [facility]  . Advised patient to do walk in at  [facility], patient in agreement to go to walk in ( is currently followed by [facility]) . Other interventions include: Solution-Focused Strategies, Emotional/Supportive Counseling, and Community education officer provided   . Collaboration with PCP regarding development and update of comprehensive plan of care as evidenced by provider attestation and co-signature . Inter-disciplinary care team collaboration (see longitudinal  plan of care) Patient Goals/Self-Care Activities:  . Wife will do walk in at [facility] today . Wife will keep appointment with psychiatrist in Feb . Will call 911 for life threaten emergency  Follow Up Plan: f/u In one week     Outpatient Encounter Medications as of 09/13/2020  Medication Sig  . Cetirizine HCl 10 MG CAPS Take 1 capsule (10 mg total) by mouth at bedtime. For nasal congestion  . diclofenac Sodium (VOLTAREN) 1 % GEL Apply 2 g topically 4 (four) times daily as  needed.  Marland Kitchen FLUoxetine (PROZAC) 20 MG tablet Take 1 tablet (20 mg total) by mouth daily.  . fluticasone (FLONASE) 50 MCG/ACT nasal spray Place 2 sprays into both nostrils daily.  Marland Kitchen gabapentin (NEURONTIN) 300 MG capsule Take 1 capsule (300 mg total) by mouth 3 (three) times daily.   No facility-administered encounter medications on file as of 09/13/2020.   Review of patient status, including review of consultants reports, relevant laboratory and other test results, and collaboration with appropriate care team members and the patient's provider was performed as part of comprehensive patient evaluation and provision of chronic care management services.       Information about Care Management services was shared with Mr.  Chayim [name] today including:  1. Care Management services include personalized support from designated clinical staff supervised by his physician, including individualized plan of care and coordination with other care providers 2. Remind patient of 24/7 contact phone numbers to provider's office for assistance with urgent and routine care needs. 3. Care Management services are voluntary and patient may stop at any time .   Patient agreed to services provided today and verbal consent obtained.     Casimer Lanius, [place] / [place]   [phone] 10:40 AM

## 2020-09-17 ENCOUNTER — Ambulatory Visit: Payer: Self-pay | Admitting: Licensed Clinical Social Worker

## 2020-09-17 DIAGNOSIS — Z636 Dependent relative needing care at home: Secondary | ICD-10-CM

## 2020-09-17 DIAGNOSIS — Z139 Encounter for screening, unspecified: Secondary | ICD-10-CM

## 2020-09-17 NOTE — Patient Instructions (Signed)
  [name]  it was nice speaking with your wife today. Please call me directly [phone] if you have questions about the goals we discussed. Goals Addressed            This Visit's Progress   . Begin and Stick with Counseling-Depression   Not on track    Timeframe:  Short-Term Goal Priority:  High Start Date:        09/10/2020                     Expected End Date:     12/05/2020                  Follow Up Date 09/24/2020   Patient Goals/Self-Care Activities:  . I have placed a referral for you with [facility] [phone]    Why is this important?    Beating depression may take some time.   If you don't feel better right away, don't give up on your treatment plan.      . Caregiver Coping   Not on track    Patient's Care giver Goals/Self-Care Activities:  . Wife will do walk in at [facility] today . Wife will keep appointment with psychiatrist in Feb . Will call 911 if needed    . Find Help in My Community   Not on track    Timeframe:  Short-Term Goal Priority:  High Start Date:    09/10/2020                         Expected End Date:   12/05/2020                    Follow Up Date 09/24/2020     Patient Goals/Self-Care Activities: Over the next 30 days . Pick up and complete [employer] . Call DSS to increase food stamps after you pick up your last check . Food pick up at [employer] . Call Legal Aid  [phone] . Review list provided and call agencies for help with water bill . Review list for housing options if you decide to move . Call Medication Assistance program to help with Medication [phone]   Why is this important?    Knowing how and where to find help for yourself or family in your neighborhood and community is an important skill.      Eddie [name] received Care Management services today:  1. Care Management services include personalized  support from designated clinical staff supervised by his physician, including individualized plan of care and coordination with other care providers 2. 24/7 contact [phone] for assistance for urgent and routine care needs. 3. Care Management are voluntary services and be declined at any time by calling the office.  Patient's wife verbalizes understanding of instructions provided today.    Follow up plan: SW will follow up with patient by phone over the next week  Maurine Cane, LCSW

## 2020-09-17 NOTE — Chronic Care Management (AMB) (Signed)
Care Management Clinical Social Work Note  09/17/2020 Name: Eddie [name] MRN: [id number] DOB: 06-Feb-1976  Eddie [name] is a 45 y.o. year old male who is a primary care patient of No primary care provider on file..  The Care Management team was consulted for assistance with chronic disease management and coordination needs by Dr. Rande Brunt with patient's wife by telephone for follow up visit in response to provider referral for social work chronic care management and care coordination services  Consent to Services:  [name] and wife were given information about Care Management services today including:  1. Care Management services includes personalized support from designated clinical staff supervised by his physician, including individualized plan of care and coordination with other care providers 2. 24/7 contact phone numbers for assistance for urgent and routine care needs. 3. The patient may stop case management services at any time by phone call to the office staff.  Patient's wife agreed to services and consent obtained.   Assessment/Interventions:  Collaboration with Systems analyst at [place]. Patient's wife reached out to her for support.  Per wife patient is patient at [facility]. While trying to assist,  Patient's wife became upset during this encounter and hung up the phone on LCSW.  Review of patient past medical history, allergies, medications, and health status, including review of relevant consultants reports was performed today as part of a comprehensive evaluation and provision of chronic care management and care coordination services.  SDOH (Social Determinants of Health) assessments and interventions performed:   Advanced Directives Status: Not addressed in this encounter. Follow Up Plan: SW will follow up with patient and wife by phone over the next week  Care Plan  No Known Allergies  Outpatient Encounter Medications as of  09/17/2020  Medication Sig  . Cetirizine HCl 10 MG CAPS Take 1 capsule (10 mg total) by mouth at bedtime. For nasal congestion  . diclofenac Sodium (VOLTAREN) 1 % GEL Apply 2 g topically 4 (four) times daily as needed.  Marland Kitchen FLUoxetine (PROZAC) 20 MG tablet Take 1 tablet (20 mg total) by mouth daily.  . fluticasone (FLONASE) 50 MCG/ACT nasal spray Place 2 sprays into both nostrils daily.  Marland Kitchen gabapentin (NEURONTIN) 300 MG capsule Take 1 capsule (300 mg total) by mouth 3 (three) times daily.   No facility-administered encounter medications on file as of 09/17/2020.    Patient Active Problem List   Diagnosis Date Noted  . Vision loss 08/26/2020  . Chronic heel pain, right 08/26/2020  . Hearing loss 01/26/2020  . Right ear pain 10/03/2019  . Healthcare maintenance 04/13/2019  . Depression 10/15/2017  . Neuropathy 10/15/2017  . 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   Start Date: 09/10/2020  Expected End Date: 12/05/2020  Recent Progress: On track  Priority: High  Note:   Objective:  PHQ-9 score of 20 is an indication of severe symptoms of depression. Depression screen [facility] 2/9 08/26/2020 07/11/2020 01/24/2020  Decreased Interest 3 3 1   Down, Depressed, Hopeless 3 3 1   PHQ - 2 Score 6 6 2   Altered sleeping 2 3 -  Tired, decreased energy 3 3 -  Change in appetite 3 3 -  Feeling bad or failure about yourself  2 3 -  Trouble concentrating 2 3 -  Moving slowly or fidgety/restless 2 2 -  Suicidal thoughts 0 0 -  PHQ-9 Score 20 23 -    Assessment, progress and current barriers:  Patient is currently experiencing symptoms of  anxiety and  depression, per wife which seems to be exacerbated after being diagnoses with COVID. Anxiety levels increased limiting his ability to work in addition to other chronic medical concerns. Patient is now open and wants to start counseling.  Reports the medication Dr. Nancy Fetter started him on is not working.   [name] knowledge of where and how to connect for counseling and psychiatry do to not having medical insurance . needs Support, Education, and Care Coordination in order to meet unmet need.  Clinical Interventions:  . Assessed patient's previous treatment, needs, coping skills, current treatment, support system and barriers to care . Reviewed appropriate assessments : PHQ 9 . Discussed several options for long term counseling based on need and uninsured. Assisted patient with narrowing the options down to [facility]  ) . Reviewed mental health medications with patient prescribed by PCP and discussed compliance  . Referral placed [facility] . Other interventions include: Motivational Interviewing, Solution-Focused Strategies, Emotional/Supportive Counseling, and Referred to psychiatrist  . Collaboration with PCP regarding development and update of comprehensive plan of care as evidenced by provider attestation and co-signature . Inter-disciplinary care team collaboration (see longitudinal plan of care) Patient Goals/Self-Care Activities:  . I have placed a referral for you with [facility] [phone]   Follow Up Plan:  SW will follow up with patient by phone over the next 2 weeks      Problem Identified: Barriers to Treatment   Goal: Barriers to Treatment Identified / Over the next 90 days, patient will  connect with community resources provided   Start Date: 09/10/2020  Expected End Date: 12/05/2020  Recent Progress: Not on track  Priority: High  Assessment, progress & current barriers:  patient's wife picked up information and  had questions about where to return [employer] and [employer] ;   Wife reports hot water is off. LCSW received return call from patient's leasing office but would not provide any information until patient's wife provided release of information.  Explained this to patient's wife.  She became upset and hung up the phone on LCSW. . Needs assistance with getting water turned on.  Has cold water but hot water not working. Wife is flustered and not able to understand why the hot water is not working and not able to get answers from the apartment office. . Patient unable to work and wife lost job this week. Rent is being paid until March. Has no insurance; has not applies for disability; currently receiving food stamps but need additional food resources. Requested information from legal Aide need legal support. Patient needs assistance with community resources and support. Marland Kitchen  acknowledges deficits with meeting this unmet need Clinical Interventions:  . collaboration with PCP regarding development and update of comprehensive plan of care as evidenced by provider attestation and co-signature . Inter-disciplinary care team collaboration (see longitudinal plan of care) . Assessment of needs, barriers , agencies contacted, as well as how impacting  . Called leasing office [place] [phone] with patient on line to assess what is needed to get hot water on in unit ( left voice message)  .  Called leasing office 2 more times  this morning ( no answer) . Review various resources and discussed options   . Provided patient with information and copy of [employer] application as well as [employer] ; Discussed options to where to return it or contacting [employer] [facility] for an appointment o Where to apply for Disability and phone number; [facility] community food resources with [employer] ; contact information for Legal Aid; Resources that may assist with water bill   . Other  interventions provided:Solution-Focused Strategies, Emotional/Supportive Counseling, and Problem Solving Patient Goals/Self-Care Activities: Over the next 30 days . Pick up and complete [employer] . Call DSS to increase food stamps after you pick up your last check . Food pick up at [employer] . Call Legal Aid  [phone] . Review list provided and call agencies for help with water bill . Review list for housing options if you decide to move . Call Medication Assistance program to help with Medication [phone] Follow Up Plan: SW will follow up with patient by phone over the next  week   Problem Identified: Caregiver Stress   Goal: Caregiver Coping /  Patient's wife will connect for counseling to reduce or manage symptoms of anxiety and stress   Start Date: 09/13/2020  Expected End Date: 12/05/2020  Recent Progress: On track  Priority: High  Assessment, progress and current barriers:  patient's wife went to [facility].  She also reports that she will f/u with her mental health provider . Patient's wife is currently experiencing symptoms of stress, feeling overwhelmed, crying,  . difficult thinking which seems to be exacerbated by caring for her husband and psychosocial stressors.    . needs Support, Education, and Care Coordination in order to meet unmet need.  Clinical Interventions:  . Assessed thoughts of SI, plan and access to means. Denies SI but reports in crisis mode and does not want it to get to SI . Assessed patient's previous treatment, needs, coping skills, current treatment, support system and barriers to care . Discussed several options for crisis treatment based on need and insurance. Assisted patient with narrowing the options down to [facility]  . Advised patient to do walk in at  [facility], patient in agreement to go to walk in ( is [facility]) . Other interventions include: Solution-Focused Strategies, Emotional/Supportive Counseling, and Community education officer provided   . Collaboration with PCP regarding development and update of comprehensive plan of care as evidenced by provider attestation and co-signature . Inter-disciplinary care team collaboration (see longitudinal plan of care) Patient Goals/Self-Care Activities:  . Wife will do walk in at [facility] today . Wife will keep appointment with psychiatrist in Feb . Will call 911 for life threaten emergency  Follow Up Plan: f/u In one week     Casimer Lanius, [place] / [place]   [phone] 11:15 AM

## 2020-09-18 ENCOUNTER — Ambulatory Visit: Payer: Self-pay | Admitting: Critical Care Medicine

## 2020-09-19 ENCOUNTER — Telehealth: Payer: Self-pay | Admitting: Family Medicine

## 2020-09-19 NOTE — Telephone Encounter (Signed)
I return Pt call, spoke with the wife an schedule a financial appt for 09/27/20

## 2020-09-19 NOTE — Telephone Encounter (Signed)
Copied from [place] [phone]. Topic: General - Inquiry >> Sep 19, 2020  9:10 AM Gillis Ends D wrote: Reason for CRM: Patient would like a call back from [facility]. He can be reached at [phone]. Please advise

## 2020-09-24 ENCOUNTER — Ambulatory Visit: Payer: Self-pay | Admitting: Licensed Clinical Social Worker

## 2020-09-24 DIAGNOSIS — Z7189 Other specified counseling: Secondary | ICD-10-CM

## 2020-09-24 NOTE — Chronic Care Management (AMB) (Signed)
Care Management Clinical Social Work Note  09/24/2020 Name: Elzia Hott MRN: [id number] DOB: 1976/03/10  [name] Bronson Bressman is a 45 y.o. year old male who is a primary care patient of Patient, No Pcp Per.  The Care Management team was consulted for assistance with chronic disease management and coordination needs.  Engaged with patient's wife by telephone for follow up visit in response to provider referral for social work chronic care management and care coordination services.  Per patient talk to wife.  Consent to Services:  Mr. Enrrique Mierzwa was given information about Care Management services today including:  1. Care Management services includes personalized support from designated clinical staff supervised by his physician, including individualized plan of care and coordination with other care providers 2. 24/7 contact phone numbers for assistance for urgent and routine care needs. 3. The patient may stop case management services at any time by phone call to the office staff.  Patient and wife agreed to services and consent obtained.   Assessment: Patient continues to experience difficulty with managing symptoms of depression, chronic health and social stressors. See Care Plan below for interventions and patient self-care actives. Follow up Plan: Patient would like continued follow-up.  CCM LCSW will f/u with patient and wife in 2 weeks. Patient will call office if needed prior to next encounter Review of patient past medical history, allergies, medications, and health status, including review of relevant consultants reports was performed today as part of a comprehensive evaluation and provision of chronic care management and care coordination services.  SDOH (Social Determinants of Health) assessments and interventions performed:    Advanced Directives Status: Not addressed in this encounter.  Care Plan  No Known Allergies  Outpatient Encounter Medications as of 09/24/2020   Medication Sig  . Cetirizine HCl 10 MG CAPS Take 1 capsule (10 mg total) by mouth at bedtime. For nasal congestion  . diclofenac Sodium (VOLTAREN) 1 % GEL Apply 2 g topically 4 (four) times daily as needed.  Marland Kitchen FLUoxetine (PROZAC) 20 MG tablet Take 1 tablet (20 mg total) by mouth daily.  . fluticasone (FLONASE) 50 MCG/ACT nasal spray Place 2 sprays into both nostrils daily.  Marland Kitchen gabapentin (NEURONTIN) 300 MG capsule Take 1 capsule (300 mg total) by mouth 3 (three) times daily.   No facility-administered encounter medications on file as of 09/24/2020.    Patient Active Problem List   Diagnosis Date Noted  . Vision loss 08/26/2020  . Chronic heel pain, right 08/26/2020  . Hearing loss 01/26/2020  . Right ear pain 10/03/2019  . Healthcare maintenance 04/13/2019  . Depression 10/15/2017  . Neuropathy 10/15/2017  . 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   Care Plan : Clinical Social Work  Updates made by Maurine Cane, LCSW since 09/24/2020 12:00 AM  Problem: Emotional Distress   Goal: Emotional Health Supported /  Over the next 30 days, patient will connect for ongoing counseling and medication management .   Start Date: 09/10/2020  Expected End Date: 12/05/2020  This Visit's Progress: Not on track  Recent Progress: On track  Priority: High  Objective:  PHQ-9 score of 20 is an indication of severe symptoms of depression. Depression screen [facility] 2/9 08/26/2020 07/11/2020 01/24/2020  Decreased Interest 3 3 1   Down, Depressed, Hopeless 3 3 1   PHQ - 2 Score 6 6 2   Altered sleeping 2 3 -  Tired, decreased energy 3 3 -  Change in appetite  3 3 -  Feeling bad or failure about yourself  2 3 -  Trouble concentrating 2 3 -  Moving slowly or fidgety/restless 2 2 -  Suicidal thoughts 0 0 -  PHQ-9 Score 20 23 -    Current barriers:  Patient is currently experiencing symptoms of  anxiety and depression, per wife which seems to be  exacerbated after being diagnoses with COVID.  Marland Kitchen Anxiety levels increased limiting his ability to work in addition to other chronic medical concerns.  . Patient did not want counseling but is now open and wants to start counseling.   . Reports the medication Dr. Wynelle Link started him on is not working.   Darylene Price knowledge of where and how to connect for counseling and psychiatry do to not having medical insurance . needs Support, Education, and Care Coordination in order to meet unmet need.  Clinical Interventions:  . Assessed patient's previous treatment, needs, coping skills, current treatment, support system and barriers to care . Reviewed appropriate assessments : PHQ 9 . Discussed several options for long term counseling based on need and uninsured. Assisted patient with narrowing the options down to [facility]  ) . Reviewed mental health medications with patient prescribed by PCP and discussed compliance  . Referral placed [facility] . Other interventions include: Motivational Interviewing, Solution-Focused Strategies, Emotional/Supportive Counseling, and Referred to psychiatrist  . Collaboration with PCP regarding development and update of comprehensive plan of care as evidenced by provider attestation and co-signature . Inter-disciplinary care team collaboration (see longitudinal plan of care) Patient Goals/Self-Care Activities:  . I have placed a referral for you with [facility]  . Please call [facility] to schedule the appointment [phone]   Problem: Barriers to Treatment   Goal: Barriers to Treatment Identified / Over the next 90 days, patient will  connect with community resources provided   Start Date: 09/10/2020  Expected End Date: 12/05/2020  This Visit's Progress: On track  Recent Progress: Not on track  Priority: High  Current barriers:  patient's wife picked up information and had  questions about where to return [employer] and [employer];    . Has not been able to turn in [facility]  . Patient unable to work and wife lost job this week. Rent is being paid until March. Has no insurance; has not applies for disability; currently receiving food stamps but need additional food resources. Requested information from legal Aide need legal support. Patient needs assistance with community resources and support. Marland Kitchen  acknowledges deficits with meeting this unmet need Clinical Interventions:  . collaboration with PCP regarding development and update of comprehensive plan of care as evidenced by provider attestation and co-signature . Inter-disciplinary care team collaboration (see longitudinal plan of care) . Assessment of needs, barriers  . Review various resources and discussed options   . Provided patient with information and copy of [employer] application as well as [employer]; Discussed options to where to return it or contacting [employer] [facility] for an appointment o Where to apply for Disability and phone number; [facility] community food resources with [employer] ; contact information for Legal Aid; [employer] as wife reports current apartment is too small  . Other interventions provided:Solution-Focused Strategies, Emotional/Supportive Counseling, and Problem Solving Patient Goals/Self-Care Activities: Over the next 30 days . Pick up and complete [employer] . Keep appointment 09/27/20 for [employer] .  Call DSS to increase food stamps after you pick up your last check . Food pick up at [employer] . Call Legal Aid  [phone] . Review list for housing options if you decide to move [facility] [employer] provided)  . Call Medication Assistance program to help with Medication [phone]   Problem: Caregiver Stress   Goal: Caregiver Coping /   Patient's wife will connect for counseling to reduce or manage symptoms of anxiety and stress   Start Date: 09/13/2020  Expected End Date: 12/05/2020  Recent Progress: On track  Priority: High  Note:   Current barriers:  patient's wife went to [facility].  She also reports that she will f/u with her mental health provider . Patient's wife is currently experiencing symptoms of stress, feeling overwhelmed, crying,  . difficult thinking which seems to be exacerbated by caring for her husband and psychosocial stressors.    . needs Support, Education, and Care Coordination in order to meet unmet need.  Clinical Interventions:  . Assessed thoughts of SI, plan and access to means. Denies SI but reports in crisis mode and does not want it to get to SI . Assessed patient's previous treatment, needs, coping skills, current treatment, support system and barriers to care . Discussed several options for crisis treatment based on need and insurance. Assisted patient with narrowing the options down to [facility]  . Advised patient to do walk in at  [facility], patient in agreement to go to walk in ( is currently followed by [facility]) . Other interventions include: Solution-Focused Strategies, Emotional/Supportive Counseling, and Community education officer provided   . Collaboration with PCP regarding development and update of comprehensive plan of care as evidenced by provider attestation and co-signature . Inter-disciplinary care team collaboration (see longitudinal plan of care) Patient Goals/Self-Care Activities:  . Wife will [facility] to schedule appointment for selft . Wife will keep appointment with psychiatrist in Feb . Will call 911 for life threaten emergency     Casimer Lanius, [place] / [place]   [phone] 4:37  PM

## 2020-09-24 NOTE — Patient Instructions (Signed)
  [name]  it was nice speaking with you. Please call me directly [phone] if you have questions about the goals we discussed. Goals Addressed            This Visit's Progress   . Begin and Stick with Counseling-Depression       Timeframe:  Short-Term Goal Priority:  High Start Date:        09/10/2020                     Expected End Date:     12/05/2020                    Patient Goals/Self-Care Activities:  . I have placed a referral for you with [facility]  . Please call [facility] to schedule the appointment [phone] Why is this important?    Beating depression may take some time.   If you don't feel better right away, don't give up on your treatment plan.      . Caregiver Coping       Timeframe:  Long-Range Goal Priority:  High Start Date:  09/17/2020                           Expected End Date:    12/05/2020                 Patient's Care giver Goals/Self-Care Activities:  . Wife will do walk in at [facility] today . Wife will keep appointment with psychiatrist in Feb . Will call 911 if needed    . Find Help in My Community   On track    Timeframe:  Short-Term Goal Priority:  High Start Date:    09/10/2020                         Expected End Date:   12/05/2020                     Patient Goals/Self-Care Activities: Over the next 30 days . Pick up and complete [employer] . Call DSS to increase food stamps after you pick up your last check . Food pick up at [employer] . Call Legal Aid  [phone] . Review list provided and call agencies for help with water bill . Review list for housing options if you decide to move . Call Medication Assistance program to help with Medication [phone]   Why is this important?    Knowing how and where to find help for yourself or family in your neighborhood and community is an important skill.       [name] received Care Management services today:  1. Care Management services include personalized support from designated clinical staff supervised by his physician, including individualized plan of care and coordination with other care providers 2. 24/7 contact [phone] for assistance for urgent and routine care needs. 3. Care Management are voluntary services and be declined at any time by calling the office.  Follow up plan: SW will follow up with patient by phone over the next 2 weeks as needed  Maurine Cane, LCSW

## 2020-09-27 ENCOUNTER — Ambulatory Visit: Payer: Self-pay | Attending: Family Medicine

## 2020-09-27 ENCOUNTER — Other Ambulatory Visit: Payer: Self-pay

## 2020-09-30 ENCOUNTER — Other Ambulatory Visit: Payer: Self-pay | Admitting: Family Medicine

## 2020-09-30 DIAGNOSIS — M7731 Calcaneal spur, right foot: Secondary | ICD-10-CM

## 2020-10-08 ENCOUNTER — Other Ambulatory Visit: Payer: Self-pay | Admitting: Family Medicine

## 2020-10-08 DIAGNOSIS — M7731 Calcaneal spur, right foot: Secondary | ICD-10-CM

## 2020-10-11 ENCOUNTER — Ambulatory Visit (INDEPENDENT_AMBULATORY_CARE_PROVIDER_SITE_OTHER): Payer: No Payment, Other | Admitting: Clinical

## 2020-10-11 ENCOUNTER — Other Ambulatory Visit: Payer: Self-pay

## 2020-10-11 DIAGNOSIS — F331 Major depressive disorder, recurrent, moderate: Secondary | ICD-10-CM

## 2020-10-13 NOTE — Progress Notes (Signed)
Comprehensive Clinical Assessment (CCA) Note  10/11/2020 Colon Delaossa [id number]  Chief Complaint:  Chief Complaint  Patient presents with  . Anxiety  . Depression   Visit Diagnosis:   Major depressive disorder, recurrent episode, moderate w/ anxious distress  Interpretive Summary:   Client is a 45 year old male presenting to [facility] as a walk in for outpatient behavioral health services. Client presents by referral of his primary care doctor. Client presents with his wife for the assessment. Client presents due to worsening depression and anxiety symptoms. The client's wife reported the client has struggled with a depressed mood, irritability, anxiety since December 2020. Wife reported the client was treated for COVID in December 2020 after which he continued to experience hearing loss, taste, smell, and vision impairments. Wife reported the client also dealt with anxiety about leaving the house and would say "what are you trying to do? Gwenlyn Found me" when she would go outside. Wife reported the client has stopped working because of his health and has guilt about being unable to contribute to bills. Client reported he has also put on 60 pounds. Wife reported the client is currently being managed on Fluoxetine. Client denied substance use history. Client denied inpatient treatment for mental health problems by history. Client presented oriented times five, appropriately dressed, and cooperative. Client denied suicidal/ homicidal ideation, hallucinations and delusions. Client was screened for the following SDOH: GAD 7 : Generalized Anxiety Score 10/11/2020  Nervous, Anxious, on Edge 2  Control/stop worrying 2  Worry too much - different things 2  Trouble relaxing 2  Restless 2  Easily annoyed or irritable 2  Afraid - awful might happen 2  Total GAD 7 Score 14  Anxiety Difficulty Very difficult   Flowsheet Row Counselor from 10/11/2020 in [facility]  PHQ-9 Total  Score 16       Treatment recommendations: psychiatric evaluation and medication management.  Therapist provided information on format of appointment (virtual or face to face).  The client was advised to call back or seek an in-person evaluation if the symptoms worsen or if the condition fails to improve as anticipated before the next scheduled appointment. Client was in agreement with treatment recommendations.     CCA Biopsychosocial Intake/Chief Complaint:  Client presents with his wife for the assessment. Wife reported the client has struggled with anxiety for a while but since December 2020 after having COVID he had lingering health problems such as hearing loss, taste deficiency, smell, and eyesight have changed. Clients' wife reported the client has been displaying worsening depression and anxiety.  Current Symptoms/Problems: Client reported depressed mood, irritability, feeling on edge   Patient Reported Schizophrenia/Schizoaffective Diagnosis in Past: No  Type of Services Patient Feels are Needed: Psychiatric evaluation and medication management.   Initial Clinical Notes/Concerns: No data recorded  Mental Health Symptoms Depression:  Change in energy/activity; Difficulty Concentrating; Hopelessness; Irritability; Increase/decrease in appetite; Worthlessness; Tearfulness   Duration of Depressive symptoms: Greater than two weeks   Mania:  None   Anxiety:   Difficulty concentrating; Irritability; Tension; Worrying   Psychosis:  None   Duration of Psychotic symptoms: No data recorded  Trauma:  None   Obsessions:  None   Compulsions:  None   Inattention:  None   Hyperactivity/Impulsivity:  N/A   Oppositional/Defiant Behaviors:  None   Emotional Irregularity:  None   Other Mood/Personality Symptoms:  No data recorded   Mental Status Exam Appearance and self-care  Stature:  Average   Weight:  Average weight   Clothing:  Casual   Grooming:  Normal    Cosmetic use:  Age appropriate   Posture/gait:  Normal   Motor activity:  Not Remarkable   Sensorium  Attention:  Normal   Concentration:  Normal   Orientation:  X5   Recall/memory:  Normal   Affect and Mood  Affect:  Depressed   Mood:  Depressed   Relating  Eye contact:  Normal   Facial expression:  Depressed   Attitude toward examiner:  Cooperative   Thought and Language  Speech flow: Clear and Coherent   Thought content:  Appropriate to Mood and Circumstances   Preoccupation:  None   Hallucinations:  None   Organization:  No data recorded  [employer] of Knowledge:  Good   Intelligence:  Average   Abstraction:  Normal   Judgement:  Good   Reality Testing:  Adequate   Insight:  Good   Decision Making:  Normal   Social Functioning  Social Maturity:  Isolates; Responsible   Social Judgement:  Normal   Stress  Stressors:  Financial   Coping Ability:  Normal   Skill Deficits:  Activities of daily living; Communication; Self-care; Interpersonal   Supports:  Family     Religion: Religion/Spirituality Are You A Religious Person?: Yes  Leisure/Recreation: Leisure / Recreation Do You Have Hobbies?: Yes  Exercise/Diet: Exercise/Diet Do You Exercise?: No Have You Gained or Lost A Significant Amount of Weight in the Past Six Months?: No Do You Follow a Special Diet?: No Do You Have Any Trouble Sleeping?: No   CCA Employment/Education Employment/Work Situation: Employment / Work Situation Employment situation: Unemployed Patient's job has been impacted by current illness: Yes Describe how patient's job has been impacted: Client reported he stopped working because of his anxiety.  Education: Education Last Grade Completed: 5 Did Teacher, adult education From [employer]?: No   CCA Family/Childhood History Family and Relationship History: Family history Marital status: Married Number of Years Married: 79 What types of issues  is patient dealing with in the relationship?: Client reported he has become more irritable with his wife and he does not want to be. Does patient have children?: Yes How many children?: 1 How is patient's relationship with their children?: Client has a 84 year old son. Client reported the relationships is strained because of his sons life choices.  Childhood History:  Childhood History By whom was/is the patient raised?: Both parents Additional childhood history information: Client reported he had a good childhood he is from [place], they didn't have money but were filled with love. No abuse. Good father and mother. Patient's description of current relationship with people who raised him/her: Client reported a good relationship with his parents. Client reported he has been worried about his mother because she has COVID and he can't visit her in [place]. Does patient have siblings?: Yes Number of Siblings: 6 Did patient suffer any verbal/emotional/physical/sexual abuse as a child?: No Did patient suffer from severe childhood neglect?: No Has patient ever been sexually abused/assaulted/raped as an adolescent or adult?: No Was the patient ever a victim of a crime or a disaster?: No Witnessed domestic violence?: No Has patient been affected by domestic violence as an adult?: No  Child/Adolescent Assessment:     CCA Substance Use Alcohol/Drug Use: Alcohol / Drug Use History of alcohol / drug use?: No history of alcohol / drug abuse  ASAM's:  Six Dimensions of Multidimensional Assessment  Dimension 1:  Acute Intoxication and/or Withdrawal Potential:      Dimension 2:  Biomedical Conditions and Complications:      Dimension 3:  Emotional, Behavioral, or Cognitive Conditions and Complications:     Dimension 4:  Readiness to Change:     Dimension 5:  Relapse, Continued use, or Continued Problem Potential:     Dimension 6:  Recovery/Living  Environment:     ASAM Severity Score:    ASAM Recommended Level of Treatment:     Substance use Disorder (SUD)    Recommendations for Services/Supports/Treatments: Recommendations for Services/Supports/Treatments Recommendations For Services/Supports/Treatments: Medication Management  DSM5 Diagnoses: Patient Active Problem List   Diagnosis Date Noted  . Vision loss 08/26/2020  . Chronic heel pain, right 08/26/2020  . Hearing loss 01/26/2020  . Right ear pain 10/03/2019  . Healthcare maintenance 04/13/2019  . Depression 10/15/2017  . Neuropathy 10/15/2017  . 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

## 2020-11-27 ENCOUNTER — Ambulatory Visit (INDEPENDENT_AMBULATORY_CARE_PROVIDER_SITE_OTHER): Payer: No Payment, Other | Admitting: Psychiatry

## 2020-11-27 ENCOUNTER — Other Ambulatory Visit (HOSPITAL_COMMUNITY): Payer: Self-pay | Admitting: Psychiatry

## 2020-11-27 ENCOUNTER — Encounter (HOSPITAL_COMMUNITY): Payer: Self-pay | Admitting: Psychiatry

## 2020-11-27 ENCOUNTER — Other Ambulatory Visit: Payer: Self-pay

## 2020-11-27 DIAGNOSIS — F32A Depression, unspecified: Secondary | ICD-10-CM

## 2020-11-27 DIAGNOSIS — F320 Major depressive disorder, single episode, mild: Secondary | ICD-10-CM

## 2020-11-27 MED ORDER — FLUOXETINE HCL 20 MG PO TABS: 40.0000 mg | ORAL_TABLET | Freq: Every day | ORAL | 2 refills | Status: DC

## 2020-11-27 NOTE — Progress Notes (Signed)
Psychiatric Initial Adult Assessment  Virtual Visit via Telephone Note  I connected with Eddie [name] on 11/27/20 at 11:00 AM EDT by telephone and verified that I am speaking with the correct person using two identifiers.  Location: Patient: home Provider: Clinic   I discussed the limitations, risks, security and privacy concerns of performing an evaluation and management service by telephone and the availability of in person appointments. I also discussed with the patient that there may be a patient responsible charge related to this service. The patient expressed understanding and agreed to proceed.   I provided 30 minutes of non-face-to-face time during this encounter.   Patient Identification: Eddie [name] MRN:  [id number] Date of Evaluation:  11/27/2020 Referral Source: Eddie Button, MD Chief Complaint:  "I was talking to much pain medications so they gave me subutex" Visit Diagnosis:    ICD-10-CM   1. Mild depression ([place])  F32.0 FLUoxetine (PROZAC) 20 MG tablet    History of Present Illness: 45 year old male seen today for initial psychiatric evaluation.  He was referred to outpatient psychiatry from his primary care doctor.  He has a psychiatric history of opioid dependence, cocaine dependence (in remission for 26 years), depression, and anxiety.  He is currently managed on Subutex and Prozac 20 mg.  Today he notes his medications are somewhat effective in managing his psychiatric condition.  Today patient was unable to log in virtually so his appointment was done over the phone.  During assessment he notes that he was irritable with his wife.  Provider heard patient arguing with his wife throughout the exam.  Patient notes that his wife wants him to get better in a day and notes that tt is not happening.  He notes that he struggled from opioid dependence and reports that he is just beginning to get better.  He notes that he has minimal anxiety and depression however  reports that reports that he would like his Prozac increased to help manage his anxiety and depression.  Today provider conducted a GAD-7 and patient scored a 5.  Provider also conducted a PHQ-9 and patient scored a 2.  He endorses adequate sleep and appetite.  He denies SI/HI/VAH or paranoia. Patient notes that his mood fluctuates.  He notes that he is often irritable and has racing thoughts.  He denies other symptoms of mania.  Patient notes he is from [place].  He notes that when he was a child he witnessed the [place] War.  He notes that several times he heard bombs and notes that his parents were forced to do manual labor.  He endorses nightmares at times.  He denies avoidant behaviors or flashbacks.  Today he is agreeable to increasing Prozac 20 mg to 40 mg to help manage symptoms of anxiety and depression.  He will continue all other medications prescribed.  Patient informed that he could receive his Subutex from to the Suboxone clinic or [facility] treatment center for further.  Writer gave patient the number and address to both these facilities.  Writer recommended patient follow-up with therapy.  He notes that he is currently seeing a therapist no other concerns noted at this time.   Associated Signs/Symptoms: Depression Symptoms:  depressed mood, psychomotor agitation, (Hypo) Manic Symptoms:  Elevated Mood, Flight of Ideas, Irritable Mood, Anxiety Symptoms:  Excessive Worry, Psychotic Symptoms:  Denies PTSD Symptoms: Had a traumatic exposure:  Notes that he lived in [place] and notes that this was traumatic. Re-experiencing:  Nightmares  Past  Psychiatric History: Depression, anxiety, Cocaine use (in remission), opioid dependence Previous Psychotropic Medications: Xanax, hydroxyzine, prozac, and gabapentin  Substance Abuse History in the last 12 months:  Yes.    Consequences of Substance Abuse: NA  Past Medical History:  Past Medical History:  Diagnosis Date  .  Depression   . Encounter to establish care 10/15/2017    Past Surgical History:  Procedure Laterality Date  . HERNIA REPAIR    . SPLENECTOMY, TOTAL      Family Psychiatric History: Brother depression and anxiety, sister anxiety and depression, daughter nephew substance abuse, son substance abuse  Family History:  Family History  Problem Relation Age of Onset  . Diabetes Mother   . Hypertension Mother   . Diabetes Father   . Hypertension Father     Social History:   Social History   Socioeconomic History  . Marital status: Married    Spouse name: Not on file  . Number of children: Not on file  . Years of education: Not on file  . Highest education level: Not on file  Occupational History  . Not on file  Tobacco Use  . Smoking status: Never Smoker  . Smokeless tobacco: Never Used  Substance and Sexual Activity  . Alcohol use: Never  . Drug use: Never  . Sexual activity: Not on file  Other Topics Concern  . Not on file  Social History Narrative  . Not on file   Social Determinants of Health   Financial Resource Strain: High Risk  . Difficulty of Paying Living Expenses: Very hard  Food Insecurity: Food Insecurity Present  . Worried About Charity fundraiser in the Last Year: Sometimes true  . Ran Out of Food in the Last Year: Sometimes true  Transportation Needs: Not on file  Physical Activity: Not on file  Stress: Stress Concern Present  . Feeling of Stress : Very much  Social Connections: Not on file    Additional Social History: Patient resides in [place]. He has one biological son and two step children.   Allergies:  No Known Allergies  Metabolic Disorder Labs: No results found for: HGBA1C, MPG No results found for: PROLACTIN No results found for: CHOL, TRIG, HDL, CHOLHDL, VLDL, LDLCALC No results found for: TSH  Therapeutic Level Labs: No results found for: LITHIUM No results found for: CBMZ No results found for: VALPROATE  Current  Medications: Current Outpatient Medications  Medication Sig Dispense Refill  . Cetirizine HCl 10 MG CAPS Take 1 capsule (10 mg total) by mouth at bedtime. For nasal congestion 30 capsule 11  . diclofenac Sodium (VOLTAREN) 1 % GEL Apply 2 g topically 4 (four) times daily as needed. 150 g 0  . FLUoxetine (PROZAC) 20 MG tablet Take 2 tablets (40 mg total) by mouth daily. 60 tablet 2  . fluticasone (FLONASE) 50 MCG/ACT nasal spray Place 2 sprays into both nostrils daily. 16 g 6  . gabapentin (NEURONTIN) 300 MG capsule Take 1 capsule (300 mg total) by mouth 3 (three) times daily. 90 capsule 3   No current facility-administered medications for this visit.    Musculoskeletal: Strength & Muscle Tone: Unable to assess due to telephone visit Gait & Station: Unable to assess due to telephone visit Patient leans: N/A  Psychiatric Specialty Exam: Review of Systems  There were no vitals taken for this visit.There is no height or weight on file to calculate BMI.  General Appearance: Unable to assess due to telephone visit  Eye Contact:  Unable to assess due to telephone visit  Speech:  Clear and Coherent and Normal Rate  Volume:  Normal  Mood:  Anxious and Depressed  Affect:  Appropriate and Congruent  Thought Process:  Coherent, Goal Directed and Linear  Orientation:  Full (Time, Place, and Person)  Thought Content:  WDL and Logical  Suicidal Thoughts:  No  Homicidal Thoughts:  No  Memory:  Immediate;   Good Recent;   Good Remote;   Good  Judgement:  Good  Insight:  Good  Psychomotor Activity:  Normal  Concentration:  Concentration: Good and Attention Span: Good  Recall:  Good  Fund of Knowledge:Good  Language: Good  Akathisia:  No  Handed:  Right  AIMS (if indicated):  Not done  Assets:  Communication Skills Desire for Improvement Financial Resources/Insurance Housing Leisure Time Social Support  ADL's:  Intact  Cognition: WNL  Sleep:  Good   Screenings: GAD-7   Doctor, general practice Visit from 11/27/2020 in [facility] Counselor from 10/11/2020 in [facility]  Total GAD-7 Score 5 14    PHQ2-9   [place] Office Visit from 11/27/2020 in [facility] Counselor from 10/11/2020 in [facility] Office Visit from 08/26/2020 in [place] Office Visit from 07/11/2020 in [place] Office Visit from 01/24/2020 in [place]  PHQ-2 Total Score 2 4 6 6 2   PHQ-9 Total Score 2 16 20 23  --    [place] Office Visit from 11/27/2020 in [place] No Risk      Assessment and Plan: Patient endorses increased irritability, fluctuations in mood, anxiety, and depression.  Today he is agreeable to increasing Prozac 20 mg to 40 mg to help manage anxiety and depression.  He will follow up with [facility] treatment center or the Suboxone clinic for refills on his Subutex.  1. Mild depression (HCC)  Increased- FLUoxetine (PROZAC) 20 MG tablet; Take 2 tablets (40 mg total) by mouth daily.  Dispense: 60 tablet; Refill: 2  Follow-up in 3 months Follow-up with therapy   Salley Slaughter, NP 3/23/202211:55 AM

## 2020-12-07 ENCOUNTER — Other Ambulatory Visit: Payer: Self-pay

## 2020-12-09 ENCOUNTER — Other Ambulatory Visit: Payer: Self-pay

## 2020-12-18 ENCOUNTER — Encounter: Payer: Self-pay | Admitting: Family Medicine

## 2020-12-18 ENCOUNTER — Other Ambulatory Visit: Payer: Self-pay

## 2020-12-18 ENCOUNTER — Ambulatory Visit (INDEPENDENT_AMBULATORY_CARE_PROVIDER_SITE_OTHER): Payer: Self-pay | Admitting: Family Medicine

## 2020-12-18 VITALS — BP 120/82 | HR 76 | Ht 63.0 in | Wt 243.4 lb

## 2020-12-18 DIAGNOSIS — F32A Depression, unspecified: Secondary | ICD-10-CM

## 2020-12-18 DIAGNOSIS — F1990 Other psychoactive substance use, unspecified, uncomplicated: Secondary | ICD-10-CM

## 2020-12-18 DIAGNOSIS — F320 Major depressive disorder, single episode, mild: Secondary | ICD-10-CM

## 2020-12-18 DIAGNOSIS — J309 Allergic rhinitis, unspecified: Secondary | ICD-10-CM

## 2020-12-18 MED ORDER — FLUTICASONE PROPIONATE 50 MCG/ACT NA SUSP: 2.0000 | Freq: Every day | NASAL | 6 refills | Status: DC

## 2020-12-18 MED ORDER — CETIRIZINE HCL 10 MG PO CAPS: 10.0000 mg | ORAL_CAPSULE | Freq: Every day | ORAL | 11 refills | Status: DC

## 2020-12-18 MED ORDER — FLUOXETINE HCL 20 MG PO TABS: 40.0000 mg | ORAL_TABLET | Freq: Every day | ORAL | 2 refills | Status: DC

## 2020-12-18 MED FILL — Fluoxetine HCl Tab 20 MG: ORAL | 30 days supply | Qty: 60 | Fill #0 | Status: CN

## 2020-12-18 NOTE — Assessment & Plan Note (Signed)
Reports mood has been improved after restarting fluoxetine 20 mg but is still having some periods of feeling down and requesting to increase his fluoxetine dose.  PHQ-9 score of only 1, no suicidal thoughts.  Will increase fluoxetine to 40 mg per patient request and follow-up in 1 month to assess response.

## 2020-12-18 NOTE — Patient Instructions (Signed)
It was nice seeing you today!  Increase your fluoxetine to 40 mg daily. You can take two 20 mg tablets at the same time every day.  Refilled your medications.  Follow-up in 1 month.   Please arrive at least 15 minutes prior to your scheduled appointments.  Stay well, [name], MD [place] [phone]

## 2020-12-18 NOTE — Progress Notes (Signed)
    SUBJECTIVE:   CHIEF COMPLAINT / HPI:   Depression Feeling more down on occasion and wants to increase his fluoxetine. Has been playing with his dogs, exercising, and watching TV to relieve stress.  He has been going to therapy.  Substance use disorder Recently started on Subutex at [facility] 2 months ago. Was addicted to opioids since he got a foot surgery and hernia repair (back in 2011 and 2012). Was using Percocets and Oxycontin. Denies IV drug use history.   PERTINENT  PMH / PSH: depression, anxiety  OBJECTIVE:   BP 120/82   Pulse 76   Ht 5\' 3"  (1.6 m)   Wt 243 lb 6.4 oz (110.4 kg)   SpO2 96%   BMI 43.12 kg/m   General: obese male, NAD CV: RRR, no murmurs Pulm: CTAB, no wheezes or rales  Depression screen [facility] 2/9 12/18/2020  Decreased Interest 0  Down, Depressed, Hopeless 1  PHQ - 2 Score 1  Altered sleeping 0  Tired, decreased energy 0  Change in appetite 0  Feeling bad or failure about yourself  0  Trouble concentrating 0  Moving slowly or fidgety/restless 0  Suicidal thoughts 0  PHQ-9 Score 1  Some encounter information is confidential and restricted. Go to Review Flowsheets activity to see all data.     ASSESSMENT/PLAN:   Substance use disorder Previously addicted to narcotics after 2 prior surgeries back in 2011 and 2012, was using Percocets and OxyContin.  Denies IV drug use history.  He has been going to [facility] for Subutex for the past 2 months and has been clean since then.  Depression Reports mood has been improved after restarting fluoxetine 20 mg but is still having some periods of feeling down and requesting to increase his fluoxetine dose.  PHQ-9 score of only 1, no suicidal thoughts.  Will increase fluoxetine to 40 mg per patient request and follow-up in 1 month to assess response.     [name], MD [place]

## 2020-12-18 NOTE — Assessment & Plan Note (Signed)
Previously addicted to narcotics after 2 prior surgeries back in 2011 and 2012, was using Percocets and OxyContin.  Denies IV drug use history.  He has been going to [facility] for Subutex for the past 2 months and has been clean since then.

## 2020-12-19 ENCOUNTER — Telehealth: Payer: Self-pay

## 2020-12-19 ENCOUNTER — Ambulatory Visit: Payer: Self-pay | Admitting: Licensed Clinical Social Worker

## 2020-12-19 MED ORDER — FLUOXETINE HCL 40 MG PO CAPS: 40.0000 mg | ORAL_CAPSULE | Freq: Every day | ORAL | 1 refills | Status: DC

## 2020-12-19 NOTE — Telephone Encounter (Signed)
Patient calls nurse line regarding fluoxetine rx. Patient is requesting that he receive 40 mg capsules instead of tablets. Please advise if change can be made. If so, please send new rx to pharmacy.   Talbot Grumbling, RN

## 2020-12-19 NOTE — Chronic Care Management (AMB) (Signed)
    Clinical Social Work  Care Management  Unsuccessful Phone Outreach    12/19/2020 Name: Eddie [name] MRN: [id number] DOB: [birth year]  Eddie [name] is a 45 y.o. year old male who is a primary care patient of [name], MD .   F/U phone call today to assess needs, and progress with care plan goals.  Based on chart review patient has connected with mental health provider.    Telephone outreach was unsuccessful A HIPPA compliant phone message was left for the patient providing contact information and requesting a return call.   Plan:LCSW will wait for return call, if no return call is received LCSW will disconnect from care team in the next 30  Days.  Review of patient status, including review of consultants reports, relevant laboratory and other test results, and collaboration with appropriate care team members and the patient's provider was performed as part of comprehensive patient evaluation and provision of care management services.    Casimer Lanius, [place] / [place]   [phone] 12:10 PM

## 2020-12-23 ENCOUNTER — Telehealth: Payer: Self-pay | Admitting: *Deleted

## 2020-12-23 NOTE — Chronic Care Management (AMB) (Signed)
  Care Management   Note  12/23/2020 Name: Eddie [name] MRN: [id number] DOB: 03-20-1976  [name] is a 45 y.o. year old male who is a primary care patient of [name], MD and is actively engaged with the care management team. I reached out to [place] by phone today to assist with re-scheduling a follow up visit with the Licensed Clinical Social Worker  Follow up plan: Telephone appointment with care management team member scheduled for:12/25/2020  [place] [facility]

## 2020-12-24 ENCOUNTER — Ambulatory Visit: Payer: Self-pay | Admitting: Licensed Clinical Social Worker

## 2020-12-24 NOTE — Patient Instructions (Signed)
  [name]  it was nice speaking with your wife. Please call me directly if you have questions about the goals we discussed. Goals Addressed            This Visit's Progress   . COMPLETED: Caregiver Coping       Timeframe:  Long-Range Goal Priority:  High Start Date:  09/17/2020                           Expected End Date:    12/05/2020                 Patient's Care giver Goals/Self-Care Activities:  . Wife will do walk in [facility] today . Wife will keep appointment with psychiatrist in Feb . Will call 911 if needed    . Find Help in My Community   On track    Timeframe:  Short-Term Goal Priority:  High Start Date:    09/10/2020                         Expected End Date:   12/05/2020                     Patient Goals/Self-Care Activities:  .  Follow up with [employer] if no one calls you in the new few days , . I have places a referral for [employer] for [employer] . I sent a secure text from [facility] with information and phone numbers for both agencies. .  Why is this important?    Knowing how and where to find help for yourself or family in your neighborhood and community is an important skill.       [name] received Care Coordination services today:  1. Care Coordination services include personalized support from designated clinical staff supervised by his physician, including individualized plan of care and coordination with other care providers 2. 24/7 contact [phone] for assistance for urgent and routine care needs. 3. Care Coordination are voluntary services and be declined at any time by calling the office. Patient's wife verbalizes understanding of instructions provided today.    Follow up plan:Patient's wife will call office as needed  Maurine Cane, [place]

## 2020-12-24 NOTE — Chronic Care Management (AMB) (Signed)
Care Management   Clinical Social Work Note  12/24/2020 Name: Javonnie Illescas MRN: [id number] DOB: 1975-11-08  [name] Duilio Heritage is a 45 y.o. year old male who is a primary care patient of [name], MD. The CCM team was consulted to assist the patient with chronic disease management and/or care coordination needs related to: [employer]  and Caregiver Stress.   Returned phone call from patient's wife for follow up visit in response to provider referral for social work chronic care management and care coordination services.   Consent to Services:  The patient was given information about Chronic Care Management services, agreed to services, and gave verbal consent prior to initiation of services.  Please see initial visit note for detailed documentation.   Patient agreed to services and consent obtained.   Assessment: Patient is making progress with managing symptoms of depression, family continues to experience difficulty with housing concerns and connecting for community resources.. See Care Plan below for interventions and patient self-care actives. Recent life changes /stressors: no heat in home and reports concerns with Mold. Recommendation: Patient may benefit from, and is in agreement to connect with [place] .  Follow up Plan: Patient's wife does not require or desire continued follow-up. Will contact the office if needed.  LCSW will stay connected to care team but will disconnect if no needs identified in 90 days.   Review of patient past medical history, allergies, medications, and health status, including review of relevant consultants reports was performed today as part of a comprehensive evaluation and provision of chronic care management and care coordination services.     SDOH (Social Determinants of Health) assessments and interventions performed:  SDOH Interventions   Flowsheet Row Most Recent Value  SDOH  Interventions   Housing Interventions [id number] Referral       Advanced Directives Status: Not addressed in this encounter.  CCM Care Plan  No Known Allergies  Outpatient Encounter Medications as of 12/24/2020  Medication Sig  . buprenorphine (SUBUTEX) 2 MG SUBL SL tablet Place under the tongue daily.  . Cetirizine HCl 10 MG CAPS Take 1 capsule (10 mg total) by mouth at bedtime. For nasal congestion  . FLUoxetine (PROZAC) 40 MG capsule Take 1 capsule (40 mg total) by mouth daily.  . fluticasone (FLONASE) 50 MCG/ACT nasal spray Place 2 sprays into both nostrils daily.   No facility-administered encounter medications on file as of 12/24/2020.    Patient Active Problem List   Diagnosis Date Noted  . Morbid obesity ([place]) 12/18/2020  . Substance use disorder 12/18/2020  . Vision loss 08/26/2020  . Chronic heel pain, right 08/26/2020  . Hearing loss 01/26/2020  . Right ear pain 10/03/2019  . Depression 10/15/2017  . Neuropathy 10/15/2017  . H/O splenectomy 10/15/2017    Conditions to be addressed/monitored: Housing barriers  Care Plan : Clinical Social Work  Updates made by Maurine Cane, LCSW since 12/24/2020 12:00 AM  Problem: Barriers to Treatment   Goal: Connect with community resources   Start Date: 09/10/2020  Recent Progress: On track  Priority: High  Current barriers:   . Patient and wife in need of assistance with connecting to community resources for Housing barriers . Acknowledges deficits with meeting this unmet need . Unable to independently navigate community resource options without care coordination support Clinical Goals: Patient's wife will work with Housing agencies to address needs related to concerns for substandard housing. Clinical Interventions:  .  Collaboration with [name], MD regarding development and update of comprehensive plan of care as evidenced by provider attestation and co-signature . Inter-disciplinary care team collaboration (see  longitudinal plan of care) . Assessment of needs, barriers , agencies contacted, as well as how impacting  . Review various resources, discussed options and provided patient information about Department of Social Services (food stamps, [employer], and utilities assistance), Housing resources , and Referral for [employer] . Referral placed via [facility] [employer] for housing support and resources . Other interventions provided:Solution-Focused Strategies, Active listening / Reflection utilized , Emotional Supportive Provided, and Problem [place]  Patient Goals/Self-Care Activities: Over the next 60 days . Follow up with [employer] if no one calls you in the new few days , . I have places a referral for [employer] for [employer] . I sent a secure text from [facility] with information and phone numbers for both agencies.   Problem: Caregiver Stress   Goal: Patient & wife will connect for mental health treatment to reduce or manage symptoms of anxiety and stress Completed 12/24/2020  Start Date: 09/13/2020  Expected End Date: 12/05/2020  Recent Progress: On track  Priority: High  Current barriers:  No barriers to treatment identified . Patient's wife is currently experiencing symptoms of stress,  . difficult thinking which seems to be exacerbated by caring for her husband and psychosocial stressors.    . needs Support, Education, and Care Coordination in order to meet unmet need.  Clinical Interventions:  . Assessed patient's treatment, needs, coping skills, current treatment, support system and barriers to care . Discussed several options for crisis treatment based on need and insurance. Assisted patient with narrowing the options down to [facility]  . Patient is currently receiving [facility], per wife he goes daily; reports he is doing better . Wife is currently followed by [facility]) Other interventions  include: Solution-Focused Strategies, Emotional/Supportive Counseling, and Community education officer provided   . Collaboration with PCP regarding development and update of comprehensive plan of care as evidenced by provider attestation and co-signature . Inter-disciplinary care team collaboration (see longitudinal plan of care) Patient Goals/Self-Care Activities:  . Wife will keep mental health appointments . Patient will daily appointment with [facility]     Casimer Lanius, [place] / [place]   [phone] 2:25 PM

## 2020-12-25 ENCOUNTER — Telehealth: Payer: Self-pay

## 2021-01-03 ENCOUNTER — Ambulatory Visit: Payer: Self-pay | Admitting: Licensed Clinical Social Worker

## 2021-01-03 NOTE — Chronic Care Management (AMB) (Signed)
  Care Management  Collaboration  Note  01/03/2021 Name: Eddie [name] MRN: [id number] DOB: 1975/11/18  Eddie [name] Eddie [name] is a 45 y.o. year old male who is a primary care patient of Eddie Button, MD. The CCM team was consulted reference care coordination needs for [employer]  for housing.  Assessment: Patient was not interviewed or contacted during this encounter. CCM LCSW collaborated with [employer] team /[place] below for interventions and patient self-care actives.   Intervention:Conducted brief assessment and reviewed recommendations.    Follow up Plan:Patient's wife in previous encounter does not require or desire continued follow-up. Will contact the office if needed.  LCSW will stay connected to care team but will disconnect if no needs identified in 90 days from previous encounter.    Collaboration with Eddie Button, MD regarding development and update of comprehensive plan of care as evidenced by provider attestation and co-signature  Review of patient past medical history, allergies, medications, and health status, including review of pertinent consultant reports was performed as part of comprehensive evaluation and provision of care management/care coordination services.   Care Plan Conditions to be addressed/monitored per PCP order: none Patient Care Plan: Clinical Social Work  Goal: Connect with community resources Completed 01/03/2021  Start Date: 09/10/2020  This Visit's Progress: On track  Recent Progress: On track  Priority: High  Current barriers:  patient's wife connected with resources provided. . Patient and wife in need of assistance with connecting to community resources for Housing barriers . Acknowledges deficits with meeting this unmet need . Unable to independently navigate community resource options without care coordination support Clinical Goals: Patient's wife will work with Housing agencies to address needs  related to concerns for substandard housing. Clinical Interventions:  . Inter-disciplinary care team collaboration (see longitudinal plan of care) . Referral placed via [place] [facility] for housing support and resources . Collaboration update from [employer]: [employer] contacted patient's wife to offer support  & resources. She hung up on them when they told her they were unable to help before they could connect her with resources that maybe able to help her. They tried calling back to give her the information but she would not take the call.  Patient Goals/Self-Care Activities:  . Follow up with [place], [place] / [place]   [phone] 8:49 AM

## 2021-01-07 ENCOUNTER — Ambulatory Visit: Payer: Self-pay | Admitting: Licensed Clinical Social Worker

## 2021-01-07 DIAGNOSIS — Z789 Other specified health status: Secondary | ICD-10-CM

## 2021-01-07 NOTE — Patient Instructions (Signed)
  Mr. Tavari Loadholt  it was nice speaking with you. Please call me directly if you have questions about the goals we discussed. Goals Addressed      Referral to care guide for eye glass resources.   Mr. Cristina Mattern received Care Coordination services today:  1. Care Coordination services include personalized support from designated clinical staff supervised by his physician, including individualized plan of care and coordination with other care providers 2. 24/7 contact [phone] for assistance for urgent and routine care needs. 3. Care Coordination are voluntary services and be declined at any time by calling the office.  Patient's wife verbalizes understanding of instructions provided today.    Follow up plan: No follow up scheduled  Maurine Cane, [place]

## 2021-01-07 NOTE — Chronic Care Management (AMB) (Signed)
  Care Management   Note  01/07/2021 Name: Eddie [name] MRN: [id number] DOB: Jun 05, 1976  Eddie [name] is a 45 y.o. year old male who is a primary care patient of Eddie Button, MD. The CCM team was consulted by patient's wife reference care coordination needs for referral to providers that take the [place] ( eye doctor, ear nose and throat as well as ortho and help with eye glasses.  Assessment: Patient was not interviewed or contacted during this encounter. CCM LCSW spoke to his wife and collaborated with [facility] referral Coordinator for poptions.  Patient will need to discuss his need with provider during next office visit.  Provide will place referral and it will be coordinated via the referral coordinator.    Intervention:Conducted brief assessment, recommendations and relevant information discussed.  CCM LCSW will place referral to care guide for resources for eye glasses.    Follow up Plan: Patient does not require or desire continued follow-up. Will contact the office if needed, Patient is being referred to [facility] care guide. No clinical needs were identified during this encounter.  Community team will consult with CCM team if clinical needs are identified.   Collaboration with Eddie Button, MD regarding development and update of comprehensive plan of care as evidenced by provider attestation and co-signature  Review of patient past medical history, allergies, medications, and health status, including review of pertinent consultant reports was performed as part of comprehensive evaluation and provision of care management/care coordination services.     Eddie [name], Eddie [name] / Eddie [name]   [phone] 10:29 AM

## 2021-01-09 ENCOUNTER — Ambulatory Visit: Payer: Self-pay | Admitting: Licensed Clinical Social Worker

## 2021-01-09 NOTE — Chronic Care Management (AMB) (Signed)
   Care Management   Clinical Social Work Note  01/09/2021 Name: Eddie [name] MRN: [id number] DOB: [birth year]  Eddie [name] is a 45 y.o. year old male who is a primary care patient of Eddie Button, MD. The CCM team was consulted to assist the patient with chronic disease management and/or care coordination needs related to: [employer]  and Financial Difficulties related to limited income.   Returned phone call to patient's wife in response to a phone call for assistance with resources.   Consent to Services:  The patient was given information about Chronic Care Management services, agreed to services, and gave verbal consent prior to initiation of services.  Please see initial visit note for detailed documentation.   Patient agreed to services and consent obtained.   Assessment: Patient's wife provided all information during this encounter.  No Care Plan established for this encounter Recent life changes Eddie [name]: patient's wife needs money to make car payment and help with getting air condition in her home Recommendation: Patient's wife may benefit from, and is in agreement to follow up with referrals LCSW placed with [facility] and call churches that were provided today .  Follow up Plan: Patient's wife does not require or desire continued follow-up. Will contact the office if needed   Review of patient past medical history, allergies, medications, and health status, including review of relevant consultants reports was performed today as part of a comprehensive evaluation and provision of chronic care management and care coordination services.     SDOH (Social Determinants of Health) assessments and interventions performed:  SDOH Interventions   Flowsheet Row Most Recent Value  SDOH Interventions   SDOH Interventions for the Following Domains Housing  Housing Interventions [id number] Referral, Other (Comment)  [employer] has contacted patient's wife provided  with phone number to call them. also provided phone numbers to various churches to see they are able to assist.]       Advanced Directives Status: Not addressed in this encounter.  CCM Care Plan  No Known Allergies  Outpatient Encounter Medications as of 01/09/2021  Medication Sig  . buprenorphine (SUBUTEX) 2 MG SUBL SL tablet Place under the tongue daily.  . Cetirizine HCl 10 MG CAPS Take 1 capsule (10 mg total) by mouth at bedtime. For nasal congestion  . FLUoxetine (PROZAC) 40 MG capsule Take 1 capsule (40 mg total) by mouth daily.  . fluticasone (FLONASE) 50 MCG/ACT nasal spray Place 2 sprays into both nostrils daily.   No facility-administered encounter medications on file as of 01/09/2021.    Patient Active Problem List   Diagnosis Date Noted  . Morbid obesity ([place]) 12/18/2020  . Substance use disorder 12/18/2020  . Vision loss 08/26/2020  . Chronic heel pain, right 08/26/2020  . Hearing loss 01/26/2020  . Right ear pain 10/03/2019  . Depression 10/15/2017  . Neuropathy 10/15/2017  . 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    Eddie [name], Person / [place]   [phone] 10:00 AM

## 2021-01-09 NOTE — Patient Instructions (Signed)
Visit Information  Goals Addressed            This Visit's Progress   . Find Help in My Community       Timeframe:  Short-Term Goal Priority:  High Start Date:    09/10/2020                         Expected End Date:   12/05/2020                       Patient Goals/Self-Care Activities:  .  Follow up with Community housing solutions and the list of churches I provided  Why is this important?    Knowing how and where to find help for yourself or family in your neighborhood and community is an important skill.        Patient verbalizes understanding of instructions provided today and agrees to view in [place].   No follow up scheduled, please contact the office as needed  Casimer Lanius, [place] Management & Coordination  [phone]

## 2021-01-13 ENCOUNTER — Telehealth: Payer: Self-pay | Admitting: *Deleted

## 2021-01-13 NOTE — Chronic Care Management (AMB) (Signed)
  Care Management   Note  01/13/2021 Name: Mickael Mcnutt MRN: [id number] DOB: [birth year]  [name] Sinan Tuch is a 45 y.o. year old male who is a primary care patient of [name], MD and is actively engaged with the care management team.  Loma [place] Argueta returned call by phone today to speak with Neoma Laming to assist with helping pay gas bill.   Follow up plan: Will message [place], [place] Management  [place], [place] [address] Direct Dial: [email or web address] Website: [email or web address]

## 2021-01-13 NOTE — Chronic Care Management (AMB) (Signed)
  Care Management   Note  01/13/2021 Name: Eddie [name] MRN: [id number] DOB: 12/12/1975  Eddie [name] is a 45 y.o. year old male who is a primary care patient of [name], MD and is actively engaged with the care management team. I reached out to [place] by phone today to assist with returning a call due to voicemail left by Gabrielle Dare.   Follow up plan: Unsuccessful telephone outreach attempt made. A HIPAA compliant phone message was left for the patient providing contact information and requesting a return call.  The care management team will reach out to the patient again over the next 7 days.  If patient returns call to provider office, please advise to call [place] at [phone].  [place] Management

## 2021-01-14 ENCOUNTER — Telehealth: Payer: Self-pay | Admitting: Family Medicine

## 2021-01-14 NOTE — Telephone Encounter (Signed)
   Telephone encounter was:  Successful.  01/14/2021 Name: Eddie [name] MRN: [id number] DOB: 05/16/1976  [name] [name] is a 45 y.o. year old male who is a primary care patient of [name], MD . The community resource team was consulted for assistance with Financial Difficulties related to vision needs, utilities and home repairs.   Care guide performed the following interventions: Patient provided with information about care guide support team and interviewed to confirm resource needs Discussed resources to assist with utilities, home repairs, vision and hearing resources. Let patient know why I was calling, pt insisted that I speak to his wife. I found out as much information before pt's wife [name] hung the phone up abruptly. Sent patient an email to the address pt's wife gave me (please see referral notes for details). Spoke to the [employer] people to ask about vision coverage and they stated that the doctor that has been assigned to pt has to write a referral for vision and hearing needs and they will then refer pt to practices that can handle his concerns.  .  Follow Up Plan:  No further follow up planned at this time. The patient has been provided with needed resources.  [place], Care Management Phone: [phone] Email: [email or web address]

## 2021-01-14 NOTE — Progress Notes (Signed)
Spoke with Casimer Lanius Licensed Clinical Social Worker about patient request to speak to LCSW, per Casimer Lanius Licensed Clinical Social Worker send message to community resourse care guides to assist with resources for paying gas bill. SDOH referral was placed on 01/07/2021  Kimberely Mccannon  Care Guide, Embedded Care Coordination [place]  Care Management

## 2021-01-16 NOTE — Patient Instructions (Addendum)
It was nice seeing you today!  I am glad you are doing better with the medication change.  I will look into referrals for Subutex but there are no immediate options right now.  Referral placed for podiatry.   Please arrive at least 15 minutes prior to your scheduled appointments.  Stay well, [name], MD [place] [phone]

## 2021-01-16 NOTE — Progress Notes (Signed)
    SUBJECTIVE:   CHIEF COMPLAINT / HPI:   Depression At most recent visit 1 month ago, patient's fluoxetine was increased from 20mg  to 40 mg. Today, patient reports his mood is significantly improved.  He finds more enjoyment doing ordinary tasks.  Not having any periods of depression.  Son went missing a few months ago which is a big stressor.  SUD Patient has been receiving Subutex since February through [facility].  Patient is requesting referral to another provider so we can get prescription for Subutex as his sick mother is now in the [place] from [place] but he will need to travel to [place] to see her.  He currently has orange card.  Toe pain Patient reports he has had issues in the toenail of his right great toe.  He states he has had part of the nail removed but it has grown back incorrectly and has been causing him pain.  He is requesting referral to podiatry so he can have the toenail removed.  Vision loss Seen by optometrist in December and received prescription for glasses, but patient has not received them yet and needs an affordable option for glasses.  PERTINENT  PMH / PSH: Depression, anxiety, SUD  OBJECTIVE:   BP 124/78   Pulse 68   Wt 238 lb (108 kg)   SpO2 95%   BMI 42.16 kg/m   General: Obese middle-aged male, NAD CV: RRR, no murmurs Pulm: CTAB, no wheezes or rales Ext: right great toenail small and deformed Psych: affect appropriate  Depression screen [facility] 2/9 01/20/2021  Decreased Interest 0  Down, Depressed, Hopeless 0  PHQ - 2 Score 0  Altered sleeping 0  Tired, decreased energy 0  Change in appetite 0  Feeling bad or failure about yourself  0  Trouble concentrating 0  Moving slowly or fidgety/restless 0  Suicidal thoughts 0  PHQ-9 Score 0  Difficult doing work/chores Not difficult at all  Some encounter information is confidential and restricted. Go to Review Flowsheets activity to see all data.     ASSESSMENT/PLAN:   Depression Doing  well on current dose of SSRI and therapy, will continue without changes.  Substance use disorder Requesting referral to another provider so he can get Subutex prescription so he can visit his sick mother.  Doing well at [facility] for the past 3 months.  Patient without insurance on orange card making referral difficult.  Could consider transferring to a provider at [facility] capable of MAT.  We will reach back out to patient when suitable option found.   Toenail deformity Referral placed for podiatry per patient request, may be difficult given uninsured status.  Vision loss Will forward chart to SW to see if any affordable options for prescription glasses per patient request.  [name], [place]

## 2021-01-20 ENCOUNTER — Other Ambulatory Visit: Payer: Self-pay

## 2021-01-20 ENCOUNTER — Telehealth: Payer: Self-pay

## 2021-01-20 ENCOUNTER — Encounter: Payer: Self-pay | Admitting: Family Medicine

## 2021-01-20 ENCOUNTER — Ambulatory Visit (INDEPENDENT_AMBULATORY_CARE_PROVIDER_SITE_OTHER): Payer: Self-pay | Admitting: Family Medicine

## 2021-01-20 VITALS — BP 124/78 | HR 68 | Wt 238.0 lb

## 2021-01-20 DIAGNOSIS — F320 Major depressive disorder, single episode, mild: Secondary | ICD-10-CM

## 2021-01-20 DIAGNOSIS — L608 Other nail disorders: Secondary | ICD-10-CM

## 2021-01-20 DIAGNOSIS — F1990 Other psychoactive substance use, unspecified, uncomplicated: Secondary | ICD-10-CM

## 2021-01-20 DIAGNOSIS — F32A Depression, unspecified: Secondary | ICD-10-CM

## 2021-01-20 NOTE — Telephone Encounter (Signed)
Patient's wife calls nurse line requesting to speak with social worker. Patient is requesting resources for environmental control for mold and mildew. Patient's wife also reports concerns with not having heating and air conditioning.   Please advise of any resources that could be available to patient and wife.   Talbot Grumbling, RN

## 2021-01-20 NOTE — Assessment & Plan Note (Signed)
Doing well on current dose of SSRI and therapy, will continue without changes.

## 2021-01-20 NOTE — Assessment & Plan Note (Signed)
Requesting referral to another provider so he can get Subutex prescription so he can visit his sick mother.  Doing well at [facility] for the past 3 months.  Patient without insurance on orange card making referral difficult.  Could consider transferring to a provider at [facility] capable of MAT.  We will reach back out to patient when suitable option found.

## 2021-01-27 NOTE — Telephone Encounter (Signed)
Attempted to call patient multiple times with no answer. If anyone is able to reach this patient, please let him know we may have a provider at [facility] who can prescribe his Subutex. We will need records from [facility] and have him schedule follow-up with me.  Thank you, Delfino Lovett

## 2021-01-30 NOTE — Telephone Encounter (Signed)
Attempted to reach pt. No answer. LVM for pt to call the office to make appt to go over any questions about medication. Salvatore Marvel, CMA

## 2021-02-07 ENCOUNTER — Ambulatory Visit: Payer: Self-pay | Admitting: Licensed Clinical Social Worker

## 2021-02-07 DIAGNOSIS — Z139 Encounter for screening, unspecified: Secondary | ICD-10-CM

## 2021-02-07 NOTE — Patient Instructions (Signed)
   A referral has been place with the CCM community team to assist with [place], [place] Management & Coordination  [phone]

## 2021-02-07 NOTE — Chronic Care Management (AMB) (Signed)
  Care Management  Referral Note for SDOH  02/07/2021 Name: Eddie [name] MRN: [id number] DOB: 02/27/1976  Eddie [name] is a 45 y.o. year old male who is a primary care patient of [name], MD. Patient's wife left voice message reference  needs for [employer] .  Assessment: Patient was not interviewed or contacted during this encounter. Wife left voice message requesting assistance with light bill. No unmet clinical needs identified.     Intervention:Conducted brief assessment based on request. Referral placed to CCM community to assist with SDOH.   Follow up Plan: No follow up scheduled with CCM team at this time.    Review of patient past medical history, allergies, medications, and health status, including review of pertinent consultant reports was performed as part of comprehensive evaluation and provision of care management/care coordination services.    Casimer Lanius, [place] / [place]   [phone] 9:56 AM

## 2021-02-13 ENCOUNTER — Telehealth: Payer: Self-pay | Admitting: *Deleted

## 2021-02-13 NOTE — Progress Notes (Addendum)
    SUBJECTIVE:   CHIEF COMPLAINT / HPI:   Substance use disorder Has been on Subutex since mid February through [facility]. At last visit, was requesting a referral to another provider so he can visit his sick mother in [place]. Since last visit, case was discussed with Dr. Owens Shark, she could potentially prescribe for him once records are obtained from [facility] (Subutex fills were not visible on PDMP). Patient states he has been having to go to [facility] every day to get the Subutex. Fortunately, he was able to make a day trip to see his mother in [place]. Mother is now back in [place]. He and his wife are planning a trip to visit her in December.  PERTINENT  PMH / PSH: Depression, anxiety, SUD  OBJECTIVE:   BP 122/80   Pulse 74   Ht 5\' 5"  (1.651 m)   Wt 238 lb (108 kg)   SpO2 98%   BMI 39.61 kg/m   General: Obese male, NAD Pulm: respirations non-labored  ASSESSMENT/PLAN:   Substance use disorder ROI forms filled out, fax number also provided. Will plan to discuss with Dr. Owens Shark once records are obtained.     [name], MD [place]

## 2021-02-13 NOTE — Telephone Encounter (Signed)
   Telephone encounter was:  Successful.  02/13/2021 Name: Pilot Prindle MRN: [id number] DOB: [birth year]  [name] Wilian Kwong is a 45 y.o. year old male who is a primary care patient of [name], MD . The community resource team was consulted for assistance with Transportation Needs , Food Insecurity, and Caregiver Stress  Care guide performed the following interventions: offered what I have available  far as assistance she did not want o hear as everyplace I mentioned she has been to and nobody is helping them .patients wife speaks english and there is no need for intrepreter services . wife hung up on me as I had nothing new to offer her  Follow Up Plan:  No further follow up planned at this time. The patient has been provided with needed resources.  [place], Care Management  [phone] [address] , [place] [address] Email : [email or web address]

## 2021-02-13 NOTE — Patient Instructions (Addendum)
It was nice seeing you today!  Our fax number is: [phone]  Please arrive at least 15 minutes prior to your scheduled appointments.  Stay well, [name], MD [place] [phone]

## 2021-02-18 ENCOUNTER — Ambulatory Visit (INDEPENDENT_AMBULATORY_CARE_PROVIDER_SITE_OTHER): Payer: Self-pay | Admitting: Family Medicine

## 2021-02-18 ENCOUNTER — Other Ambulatory Visit: Payer: Self-pay

## 2021-02-18 ENCOUNTER — Encounter: Payer: Self-pay | Admitting: Family Medicine

## 2021-02-18 DIAGNOSIS — F1990 Other psychoactive substance use, unspecified, uncomplicated: Secondary | ICD-10-CM

## 2021-02-18 NOTE — Assessment & Plan Note (Signed)
ROI forms filled out, fax number also provided. Will plan to discuss with Dr. Owens Shark once records are obtained.

## 2021-02-18 NOTE — Progress Notes (Signed)
[facility]I completed to [facility]. Original faxed and scanned in by Meribeth Mattes. Christen Bame, CMA

## 2021-02-20 ENCOUNTER — Telehealth (HOSPITAL_COMMUNITY): Payer: No Payment, Other | Admitting: Psychiatry

## 2021-03-17 NOTE — Progress Notes (Deleted)
    SUBJECTIVE:   CHIEF COMPLAINT / HPI:   SUD Has been on Subutex since mid February through [facility]. Has been requesting referral to another provider so he can get a prescription to visit his mother, now back in [place]. Records obtained from [facility] since last visit. Case was discussed with Dr. Owens Shark who was willing to prescribe for him. However, on discussion over the phone, wife had adamantly refused to have a male provider. Today, ***  PERTINENT  PMH / PSH: Depression, anxiety, SUD  OBJECTIVE:   There were no vitals taken for this visit.  General: ***, NAD CV: RRR, no murmurs*** Pulm: CTAB, no wheezes or rales  ASSESSMENT/PLAN:   No problem-specific Assessment & Plan notes found for this encounter.     [name], MD [place]   {    This will disappear when note is signed, click to select method of visit    :1}

## 2021-03-18 ENCOUNTER — Ambulatory Visit: Payer: Self-pay | Admitting: Family Medicine

## 2021-04-16 ENCOUNTER — Ambulatory Visit: Payer: Self-pay | Admitting: Licensed Clinical Social Worker

## 2021-04-16 ENCOUNTER — Telehealth: Payer: Self-pay | Admitting: Family Medicine

## 2021-04-16 NOTE — Telephone Encounter (Signed)
Patients wife called to reschedule patients appointment with PCP. She states that the appointment he missed that she was told Dr. Hartford Poli would come in to discuss alcohol and substance abuse support and meds?  She would like to know if Dr. Hartford Poli will still be able to assist him with this during his appointment with Dr. Nancy Fetter 08/22.

## 2021-04-16 NOTE — Chronic Care Management (AMB) (Signed)
  Care Management  Consultation Note  04/16/2021 Name: Eddie [name] MRN: [id number] DOB: 1976/02/24  Eddie [name] is a 45 y.o. year old male who is a primary care patient of [name], MD. The CCM team was consulted reference care coordination needs for  substance use .  Assessment: Patient was not interviewed or contacted during this encounter.  Patient has been referred to [facility] to address concerns above and had no show appointments. Patient is also being treated by [facility] in [place].    Intervention:Conducted brief assessment, recommendations and relevant information discussed. CCM LCSW collaborated with PCP and provided the information below to share with patient and wife.    [place]   [address] [place], [place] [address]      [phone]  [facility] [place], [place] Follow up Plan: PCP will provide patient with the needed information and will consult CCM team  or place new CCM referral for future needs.  No follow up scheduled with CCM team   Review of patient past medical history, allergies, medications, and health status, including review of pertinent consultant reports was performed as part of comprehensive evaluation and provision of care management/care coordination services.   Casimer Lanius, [place] / [place]   [phone] 11:01 AM

## 2021-04-17 ENCOUNTER — Ambulatory Visit: Payer: Self-pay | Admitting: Licensed Clinical Social Worker

## 2021-04-17 DIAGNOSIS — F1990 Other psychoactive substance use, unspecified, uncomplicated: Secondary | ICD-10-CM

## 2021-04-17 NOTE — Chronic Care Management (AMB) (Signed)
  Care Management  Consultation Note  04/17/2021 Name: Eddie [name] MRN: [id number] DOB: [birth year]  Eddie [name] is a 45 y.o. year old male who is a primary care patient of [name], MD. The CCM team was consulted reference care coordination needs for  substance treatment options .  Assessment: Patient was not interviewed or contacted during this encounter.    Intervention:Conducted brief assessment, recommendations, made several calls to [facility], [facility]. Relevant information discussed. CCM LCSW collaborated with PCP and shared information via in-basket message.   All agencies called require patient to come in daily to pick of medication and they can earn  take home doses but it's a long process.  They are also required to participate in counseling and group.  Recommendation : Patient should discuss treatment options with his [facility] counselor if coming in daily is not working for him.  there are only two free options for treatment [facility] and [facility].  If patient is willing to pay he can explore community options via [facility] or [facility] [phone]. Again cost it $20.00 per day and he will have to go daily until her earns take home options   Follow up Plan: PCP will provide patient with the needed information and will consult CCM team for future needs   Review of patient past medical history, allergies, medications, and health status, including review of pertinent consultant reports was performed as part of comprehensive evaluation and provision of care management/care coordination services.   Conditions to be addressed/monitored per PCP order: , Substance abuse issues -       Casimer Lanius, [place] / [place]   [phone] 12:26 PM

## 2021-04-28 ENCOUNTER — Other Ambulatory Visit: Payer: Self-pay

## 2021-04-28 ENCOUNTER — Ambulatory Visit (INDEPENDENT_AMBULATORY_CARE_PROVIDER_SITE_OTHER): Payer: Self-pay | Admitting: Family Medicine

## 2021-04-28 VITALS — BP 123/80 | HR 85 | Ht 64.0 in | Wt 242.0 lb

## 2021-04-28 DIAGNOSIS — F32A Depression, unspecified: Secondary | ICD-10-CM

## 2021-04-28 DIAGNOSIS — J309 Allergic rhinitis, unspecified: Secondary | ICD-10-CM

## 2021-04-28 DIAGNOSIS — F1990 Other psychoactive substance use, unspecified, uncomplicated: Secondary | ICD-10-CM

## 2021-04-28 MED ORDER — CETIRIZINE HCL 10 MG PO TABS
10.0000 mg | ORAL_TABLET | Freq: Every day | ORAL | 11 refills | Status: DC
  Filled 2021-04-28 – 2021-05-22 (×2): qty 30, 30d supply, fill #0

## 2021-04-28 MED ORDER — FLUOXETINE HCL 40 MG PO CAPS: 40.0000 mg | ORAL_CAPSULE | Freq: Every day | ORAL | 1 refills | Status: DC

## 2021-04-28 MED ORDER — FLUTICASONE PROPIONATE 50 MCG/ACT NA SUSP
2.0000 | Freq: Every day | NASAL | 6 refills | Status: DC
  Filled 2021-04-28 – 2021-05-22 (×2): qty 16, 30d supply, fill #0

## 2021-04-28 NOTE — Patient Instructions (Addendum)
It was nice seeing you today!  [facility] is the only other free treatment option. Phone: [facility] is another option but costs $20/day. Phone: [phone]    Stay well, [name], [place] [phone]

## 2021-04-28 NOTE — Progress Notes (Signed)
    SUBJECTIVE:   CHIEF COMPLAINT / HPI:   Here with wife today.  She expressed frustration that he was on time for his appointment last time but was not seen.  They have still been dealing with his son who has gone missing.  Also dealing with issues with identity theft currently.  SUD Continues to go to [facility] daily for Sub[facility]ex, currently on 16 mg once daily.  Apparently, [facility] does not allow to take home for Subutex. Discussed with patient and wife that there are only two free options for SUD treatment: [facility] and [facility] costs $20/day. Wife is adamant that he needs Subutex and will not substitute for Suboxone.    PERTINENT  PMH / PSH: SUD, obesity, depression  OBJECTIVE:   BP 123/80   Pulse 85   Ht _0  (1.626 m)   Wt 242 lb (109.8 kg)   SpO2 95%   BMI 41.54 kg/m   General: Obese, NAD Pulm: No respiratory distress  ASSESSMENT/PLAN:   Substance use disorder Discussed case with Dr. Owens Shark who also met with patient and wife.  Prescribing here is not an option as they are unwilling to switch from Subutex to Suboxone.  Resources given to patient.  [facility] is likely the next best option.     [name], MD [place]

## 2021-04-28 NOTE — Assessment & Plan Note (Signed)
Discussed case with Dr. Owens Shark who also met with patient and wife.  Prescribing here is not an option as they are unwilling to switch from Subutex to Suboxone.  Resources given to patient.  [facility] is likely the next best option.

## 2021-05-05 ENCOUNTER — Other Ambulatory Visit: Payer: Self-pay

## 2021-05-22 ENCOUNTER — Other Ambulatory Visit: Payer: Self-pay

## 2021-05-29 ENCOUNTER — Other Ambulatory Visit: Payer: Self-pay

## 2021-06-24 ENCOUNTER — Other Ambulatory Visit: Payer: Self-pay

## 2021-06-24 ENCOUNTER — Ambulatory Visit (HOSPITAL_COMMUNITY)
Admission: EM | Admit: 2021-06-24 | Discharge: 2021-06-24 | Discharge status: Against Medical Advice | Payer: No Payment, Other

## 2021-06-24 NOTE — BH Assessment (Signed)
Pt reports he is going to [facility] for services and is getting Subutex daily however pt is wanting to get a 30 day prescription for Subutex. Pt denies SI, HI, AVH. TTS consulted with NP about pt medication request and informed pt that [facility] is not able to prescribe medications but provider will still see him for MSE. Pt decided to leave before seeing the provider. Waiver signed.   Pt is routine

## 2021-07-02 ENCOUNTER — Ambulatory Visit: Payer: Self-pay

## 2021-08-26 ENCOUNTER — Other Ambulatory Visit: Payer: Self-pay

## 2021-08-26 ENCOUNTER — Ambulatory Visit (INDEPENDENT_AMBULATORY_CARE_PROVIDER_SITE_OTHER): Payer: Self-pay | Admitting: Family Medicine

## 2021-08-26 VITALS — BP 142/96 | HR 86 | Temp 98.1°F | Ht 64.0 in | Wt 243.2 lb

## 2021-08-26 DIAGNOSIS — I10 Essential (primary) hypertension: Secondary | ICD-10-CM | POA: Insufficient documentation

## 2021-08-26 DIAGNOSIS — R030 Elevated blood-pressure reading, without diagnosis of hypertension: Secondary | ICD-10-CM

## 2021-08-26 DIAGNOSIS — B349 Viral infection, unspecified: Secondary | ICD-10-CM | POA: Insufficient documentation

## 2021-08-26 NOTE — Progress Notes (Signed)
° ° °  SUBJECTIVE:   CHIEF COMPLAINT / HPI:   Patient with history of recurrent ear infections presents with 3 day history of fever, sore throat, ear pain only when he drinks something and they pop. Explains that since he was a kid he had multiple ear infections, almost every year. Was not able to get a temperature but states that he knows he had a fever. Sick contacts include wife who got similar symptoms that a girl she was fixing hair for also had these symptoms. Vaccinated against flu and 2 doses of COVID. Cough without phlegm, dry cough. Had a home COVID test that was negative. Denies any ear drainage but otalgia since all these symptoms started. Says that he experiences an ear ache almost every time he gets sick. Has used mucinex last night and has been using a lot of cough medicine. Denies dyspnea.   OBJECTIVE:   Vitals:   08/26/21 1003 08/26/21 1025  BP: (!) 157/103 (!) 142/96  Pulse: 86   Temp: 98.1 F (36.7 C)   SpO2: 95%   Weight: 243 lb 3.2 oz (110.3 kg)   Height: 5\' 4"  (1.626 m)     General: Patient tired appearing, in no acute distress. HEENT: PERRLA, no optic discharge, evidence of anterior cervical LAD without posterior involvement, non-bulging or erythematous TMs, bilaterally, no drainage or edema noted, no abnormality along the pinna or other parts of outer ear bilaterally CV: RRR, no murmurs or gallops auscultated Resp: CTAB, no wheezing or rhonchi noted, good air movement noted diffusely throughout all lung fields Ext: radial pulses strong and equal bilaterally  Psych: mood appropriate   ASSESSMENT/PLAN:   Viral illness -per physical exam, no indication for pneumonia or otitis media or externa or other bacterial etiology. Symptoms most likely secondary to viral process, home COVID testing negative and out of window for treatment of flu -supportive care and reassurance provided -ED precautions discussed   Elevated blood pressure reading -BP 157/103, on repeat  142/96, likely situational in the setting of illness and taking multiple decongestants recently -follow up with PCP for continued monitoring and management as appropriate -strict ED precautions given    Donney Dice, [place]

## 2021-08-26 NOTE — Assessment & Plan Note (Signed)
-  per physical exam, no indication for pneumonia or otitis media or externa or other bacterial etiology. Symptoms most likely secondary to viral process, home COVID testing negative and out of window for treatment of flu -supportive care and reassurance provided -ED precautions discussed

## 2021-08-26 NOTE — Assessment & Plan Note (Signed)
-  BP 157/103, on repeat 142/96, likely situational in the setting of illness and taking multiple decongestants recently -follow up with PCP for continued monitoring and management as appropriate -strict ED precautions given

## 2021-08-26 NOTE — Patient Instructions (Signed)
It was great seeing you today!  Today we discussed your symptoms, it is still likely that you have a virus. Your ears look good Please make sure to get plenty of rest and drink fluids. Honey can help as well as using a humidifier along with some of the other things we discussed.  If you are having any trouble breathing please go to the emergency department. Also if you have feeling dizzy, changes in vision, weakness and notice a blood pressure of over 160 then please go to the emergency department.  Please follow up at your next scheduled appointment in 1 month, if anything arises between now and then, please don't hesitate to contact our office.   Thank you for allowing [place] to be a part of your medical care!  Thank you, Dr. Larae Grooms

## 2021-10-20 ENCOUNTER — Other Ambulatory Visit: Payer: Self-pay | Admitting: Family Medicine

## 2021-11-12 ENCOUNTER — Ambulatory Visit
Admission: RE | Admit: 2021-11-12 | Discharge: 2021-11-12 | Disposition: A | Discharge status: Home / Self Care | Payer: Self-pay | Source: Ambulatory Visit | Attending: Obstetrics and Gynecology | Admitting: Obstetrics and Gynecology

## 2021-11-12 ENCOUNTER — Other Ambulatory Visit: Payer: Self-pay | Admitting: Obstetrics and Gynecology

## 2021-11-12 ENCOUNTER — Other Ambulatory Visit: Payer: Self-pay

## 2021-11-12 DIAGNOSIS — Z111 Encounter for screening for respiratory tuberculosis: Secondary | ICD-10-CM

## 2021-12-16 ENCOUNTER — Other Ambulatory Visit: Payer: Self-pay | Admitting: Family Medicine

## 2021-12-23 ENCOUNTER — Telehealth: Payer: Self-pay

## 2021-12-23 NOTE — Telephone Encounter (Signed)
Can somebody please call patient and ask him how long he has been taking the fluoxetine at 40 mg twice daily for? I don't believe this was discussed with me or perhaps there may have been some miscommunication. If he has been on this for a while, I can resend the prescription. Thanks. ?

## 2021-12-23 NOTE — Telephone Encounter (Signed)
[facility] and patients wife call nurse line in regards to Fluoxetine prescription.  ? ?[facility] reports the patient has been taking Fluoxetine '40mg'$  BID. Therefore, the recent 4/11 prescription is too soon to fill. ? ?[facility] reports the prescription will need to be resent in reflecting change. [facility] reports the patient advised MD of this.  ? ?Attempted to discuss with patient and wife, however wife was screaming into the phone and I could not get a lot of information.  ? ?Patient does have an apt with PCP for 5/5. ? ?Please advise.  ?

## 2021-12-24 MED ORDER — FLUOXETINE HCL 40 MG PO CAPS: 40.0000 mg | ORAL_CAPSULE | Freq: Two times a day (BID) | ORAL | 0 refills | Status: DC

## 2021-12-24 NOTE — Telephone Encounter (Signed)
Called patient, verified that he has been taking fluoxetine 40 mg BID for the past 3 months. He is not having any issues with this. No other concerns. This is the max dose for fluoxetine, I have sent in a new prescription for him. ?

## 2021-12-25 ENCOUNTER — Other Ambulatory Visit: Payer: Self-pay | Admitting: Family Medicine

## 2021-12-25 DIAGNOSIS — J309 Allergic rhinitis, unspecified: Secondary | ICD-10-CM

## 2022-01-08 NOTE — Patient Instructions (Addendum)
It was nice seeing you today! ? ?[facility] ?[address] ?[place] [address] ?[place], [place] [address] ?Phone: [phone] ? ?Will also reach out to Dr. Owens Shark. ? ?Referral to GI to discuss colonoscopy. ? ?Connecting you with our social work team. ? ?Blood work today. ? ?Stay well, ?[name], MD ?[place] ?[phone] ? ?-- ? ?Make sure to check out at the front desk before you leave today. ? ?Please arrive at least 15 minutes prior to your scheduled appointments. ? ?If you had blood work today, I will send you a MyChart message or a letter if results are normal. Otherwise, I will give you a call. ? ?If you had a referral placed, they will call you to set up an appointment. Please give [place] a call if you don't hear back in the next 2 weeks. ? ?If you need additional refills before your next appointment, please call your pharmacy first.  ?

## 2022-01-08 NOTE — Progress Notes (Signed)
    SUBJECTIVE:   CHIEF COMPLAINT / HPI:  Chief Complaint  Patient presents with   Medication Management    Accompanied by wife.  Doing well on fluoxetine 40 mg BID. Hesitant to do therapy. Wife's 45 year old cousin passed away last month due to fentanyl overdose which has been a significant stressor.  Asking about suboxone prescription so he can visit his ill mother in [place]. Wondering if Dr. Owens Shark would be willing to prescribe this. [facility], has to go daily for Subutex. They do not prescribe suboxone.  Has been on treatment for for [facility]BI [facility] for the past 2-3 months. Reports negative CXR. Completes treatment in September.  Wife is asking for assistance working on obtaining Medicaid. They are amenable to referral to CCM.  Working at [employer] place now 3 days a week.  PERTINENT  PMH / PSH: depression, SUD  Patient Care Team: [name], MD as PCP - General (Family Medicine)   OBJECTIVE:   BP (!) 147/98   Pulse 86   Ht '5\' 3"'$  (1.6 m)   Wt 247 lb 6 oz (112.2 kg)   SpO2 96%   BMI 43.82 kg/m   Physical Exam Constitutional:      General: He is not in acute distress.    Appearance: He is obese.  Cardiovascular:     Rate and Rhythm: Normal rate and regular rhythm.  Pulmonary:     Effort: Pulmonary effort is normal. No respiratory distress.     Breath sounds: Normal breath sounds.  Neurological:     Mental Status: He is alert.  Psychiatric:     Comments: Appears anxious        08/26/2021   10:05 AM  Depression screen PHQ 2/9  Decreased Interest 0  Down, Depressed, Hopeless 1  PHQ - 2 Score 1  Altered sleeping 0  Tired, decreased energy 0  Change in appetite 0  Feeling bad or failure about yourself  0  Trouble concentrating 0  Moving slowly or fidgety/restless 0  Suicidal thoughts 0  PHQ-9 Score 1     {Show previous vital signs (optional):23777}    ASSESSMENT/PLAN:   Substance use disorder Has  been on Subutex with [facility] for the past year, hoping to find suboxone prescriber. Info for [employer] given, also will connect him with social work. Will also reach out to Dr. Owens Shark per their request.  Depression Doing well on max dose of fluoxetine. Encouraged therapy.  Elevated blood pressure reading Mildly elevated in office but reports home readings in the 120s-130s/80s. Advised to continue monitoring at home.   HCM - HIV screening ordered - HCV screening ordered - colonoscopy referral placed - lipid panel - A1c - PSA screening will obtain after discussion  Referral to CCM placed to assist with obtaining Medicaid, financial assistance, and further MAT treatment resources.  Return in about 6 months (around 07/12/2022) for f/u depression.   [name], MD [place]

## 2022-01-09 ENCOUNTER — Ambulatory Visit (INDEPENDENT_AMBULATORY_CARE_PROVIDER_SITE_OTHER): Payer: Self-pay | Admitting: Family Medicine

## 2022-01-09 ENCOUNTER — Encounter: Payer: Self-pay | Admitting: Family Medicine

## 2022-01-09 VITALS — BP 147/98 | HR 86 | Ht 63.0 in | Wt 247.4 lb

## 2022-01-09 DIAGNOSIS — R739 Hyperglycemia, unspecified: Secondary | ICD-10-CM

## 2022-01-09 DIAGNOSIS — F1990 Other psychoactive substance use, unspecified, uncomplicated: Secondary | ICD-10-CM

## 2022-01-09 DIAGNOSIS — R030 Elevated blood-pressure reading, without diagnosis of hypertension: Secondary | ICD-10-CM

## 2022-01-09 DIAGNOSIS — Z1211 Encounter for screening for malignant neoplasm of colon: Secondary | ICD-10-CM

## 2022-01-09 DIAGNOSIS — F32A Depression, unspecified: Secondary | ICD-10-CM

## 2022-01-09 DIAGNOSIS — Z114 Encounter for screening for human immunodeficiency virus [HIV]: Secondary | ICD-10-CM

## 2022-01-09 DIAGNOSIS — Z125 Encounter for screening for malignant neoplasm of prostate: Secondary | ICD-10-CM

## 2022-01-09 DIAGNOSIS — Z1159 Encounter for screening for other viral diseases: Secondary | ICD-10-CM

## 2022-01-09 DIAGNOSIS — Z13220 Encounter for screening for lipoid disorders: Secondary | ICD-10-CM

## 2022-01-09 DIAGNOSIS — J309 Allergic rhinitis, unspecified: Secondary | ICD-10-CM

## 2022-01-09 DIAGNOSIS — Z599 Problem related to housing and economic circumstances, unspecified: Secondary | ICD-10-CM

## 2022-01-09 MED ORDER — CETIRIZINE HCL 10 MG PO TABS: ORAL_TABLET | ORAL | 0 refills | Status: DC

## 2022-01-09 MED ORDER — FLUTICASONE PROPIONATE 50 MCG/ACT NA SUSP: NASAL | 0 refills | Status: DC

## 2022-01-09 NOTE — Assessment & Plan Note (Signed)
Has been on Subutex with [facility] for the past year, hoping to find suboxone prescriber. Info for [employer] given, also will connect him with social work. Will also reach out to Dr. Owens Shark per their request. ?

## 2022-01-09 NOTE — Assessment & Plan Note (Signed)
Doing well on max dose of fluoxetine. Encouraged therapy. ?

## 2022-01-09 NOTE — Assessment & Plan Note (Signed)
Mildly elevated in office but reports home readings in the 120s-130s/80s. Advised to continue monitoring at home. ?

## 2022-01-10 LAB — LIPID PANEL
Chol/HDL Ratio: 3.1 ratio (ref 0.0–5.0)
Cholesterol, Total: 163 mg/dL (ref 100–199)
HDL: 53 mg/dL (ref >39)
LDL Chol Calc (NIH): 82 mg/dL (ref 0–99)
Triglycerides: 166 mg/dL — ABNORMAL HIGH (ref 0–149)
VLDL Cholesterol Cal: 28 mg/dL (ref 5–40)

## 2022-01-10 LAB — HIV ANTIBODY (ROUTINE TESTING W REFLEX): HIV Screen 4th Generation wRfx: NONREACTIVE

## 2022-01-10 LAB — PSA: Prostate Specific Ag, Serum: 0.3 ng/mL (ref 0.0–4.0)

## 2022-01-10 LAB — HEMOGLOBIN A1C
Est. average glucose Bld gHb Est-mCnc: 154 mg/dL
Hgb A1c MFr Bld: 7 % — ABNORMAL HIGH (ref 4.8–5.6)

## 2022-01-10 LAB — HCV AB W REFLEX TO QUANT PCR: HCV Ab: NONREACTIVE

## 2022-01-10 LAB — HCV INTERPRETATION

## 2022-01-14 ENCOUNTER — Telehealth: Payer: Self-pay | Admitting: *Deleted

## 2022-01-14 NOTE — Telephone Encounter (Signed)
? ?  Telephone encounter was:  Unsuccessful.  01/14/2022 ?Name: Eddie [name] MRN: [id number] DOB: 07-04-1976 ? ?Unsuccessful outbound call made today to assist with:   Insurance information ? ?Outreach Attempt:  1st Attempt ? ?A HIPAA compliant voice message was left requesting a return call.  Instructed patient to call back at   Instructed patient to call back at [phone]  at their earliest convenience.  ? ? ?Lovett Sox -Selinda Eon ?Care Guide , Embedded Care Coordination ?[facility], Care Management  ?[phone] ?[address] , [place] [address] ?Email : Ashby Dawes. Greenauer-moran [email or web address] ?  ? ?

## 2022-01-15 ENCOUNTER — Telehealth: Payer: Self-pay | Admitting: Family Medicine

## 2022-01-19 ENCOUNTER — Telehealth: Payer: Self-pay | Admitting: *Deleted

## 2022-01-19 NOTE — Telephone Encounter (Signed)
Called patient to review lab results. Notable for A1c 7.0. Discussed starting medication vs lifestyle changes and re-checking in 3 months. He opted for lifestyle changes. He is working on exercising more and his wife has been working with him on improving his diet. ? ?Also discussed that prescribing Subxone here could be a potential option. Advised to send over records from [facility] and schedule follow-up appointment with me once records are sent over. Fax number provided. ?

## 2022-01-19 NOTE — Telephone Encounter (Signed)
? ?  Telephone encounter was:  Unsuccessful.  01/19/2022 ?Name: Eddie [name] MRN: [id number] DOB: December 14, 1975 ? ?Unsuccessful outbound call made today to assist with:  Insurance  ? ?Outreach Attempt:  2nd Attempt ? ?A HIPAA compliant voice message was left requesting a return call.  Instructed patient to call back at   Instructed patient to call back at [phone]  at their earliest convenience. . ? ?Eddie [name] -Selinda Eon ?Care Guide , Embedded Care Coordination ?[facility], Care Management  ?[phone] ?[address] , [place] [address] ?Email : [name] [email or web address] ?  ?

## 2022-01-22 ENCOUNTER — Telehealth: Payer: Self-pay | Admitting: *Deleted

## 2022-01-22 NOTE — Telephone Encounter (Signed)
   Telephone encounter was:  Unsuccessful.  01/22/2022 Name: Eddie [name] MRN: [id number] DOB: 10-23-1975  Unsuccessful outbound call made today to assist with:Medicaid   Outreach Attempt:  3rd Attempt.  Referral closed unable to contact patient.  A HIPAA compliant voice message was left requesting a return call.  Instructed patient to call back at   Instructed patient to call back at [phone]  at their earliest convenience.  [facility], Care Management  [phone] [address] , [place] [address] Email : Ashby Dawes. Greenauer-moran [email or web address]

## 2022-01-23 ENCOUNTER — Telehealth: Payer: Self-pay | Admitting: *Deleted

## 2022-01-23 NOTE — Telephone Encounter (Signed)
   Telephone encounter was:  Unsuccessful.  01/23/2022 Name: Eddie [name] MRN: [id number] DOB: Jun 25, 1976  Unsuccessful outbound call made today to assist with:   Insurance   Outreach Attempt:  3rd Attempt.  Referral closed unable to contact patient.  A HIPAA compliant voice message was left requesting a return call.  Instructed patient to call back at   Instructed patient to call back at [phone]  at their earliest convenience. . [facility], Care Management  [phone] [address] , [place] [address] Email : Ashby Dawes. Greenauer-moran [email or web address]

## 2022-01-30 NOTE — Patient Instructions (Incomplete)
It was nice seeing you today!  Follow-up as scheduled.  Return call to social worker Lovett Sox [phone]  Call [facility] regarding colonoscopy. Address: [address], [place], [place] [address] Phone: [phone]  Stay well, [name], MD [place] [phone]  --  Make sure to check out at the front desk before you leave today.  Please arrive at least 15 minutes prior to your scheduled appointments.  If you had blood work today, I will send you a MyChart message or a letter if results are normal. Otherwise, I will give you a call.  If you had a referral placed, they will call you to set up an appointment. Please give [place] a call if you don't hear back in the next 2 weeks.  If you need additional refills before your next appointment, please call your pharmacy first.

## 2022-01-30 NOTE — Progress Notes (Unsigned)
    SUBJECTIVE:   CHIEF COMPLAINT / HPI:  No chief complaint on file.   ***  PERTINENT  PMH / PSH: depression, SUD  Patient Care Team: [name], MD as PCP - General (Family Medicine)   OBJECTIVE:   There were no vitals taken for this visit.  Physical Exam      08/26/2021   10:05 AM  Depression screen PHQ 2/9  Decreased Interest 0  Down, Depressed, Hopeless 1  PHQ - 2 Score 1  Altered sleeping 0  Tired, decreased energy 0  Change in appetite 0  Feeling bad or failure about yourself  0  Trouble concentrating 0  Moving slowly or fidgety/restless 0  Suicidal thoughts 0  PHQ-9 Score 1     {Show previous vital signs (optional):23777}  {Labs  Heme  Chem  Endocrine  Serology  Results Review (optional):23779}  ASSESSMENT/PLAN:   No problem-specific Assessment & Plan notes found for this encounter.    No follow-ups on file.   [name], MD [place]

## 2022-02-03 ENCOUNTER — Ambulatory Visit (INDEPENDENT_AMBULATORY_CARE_PROVIDER_SITE_OTHER): Payer: Self-pay | Admitting: Family Medicine

## 2022-02-03 ENCOUNTER — Ambulatory Visit (INDEPENDENT_AMBULATORY_CARE_PROVIDER_SITE_OTHER): Payer: Self-pay

## 2022-02-03 ENCOUNTER — Encounter: Payer: Self-pay | Admitting: Family Medicine

## 2022-02-03 VITALS — BP 143/92 | HR 79 | Ht 63.0 in | Wt 247.6 lb

## 2022-02-03 DIAGNOSIS — F1990 Other psychoactive substance use, unspecified, uncomplicated: Secondary | ICD-10-CM

## 2022-02-03 DIAGNOSIS — Z23 Encounter for immunization: Secondary | ICD-10-CM

## 2022-02-03 DIAGNOSIS — F32A Depression, unspecified: Secondary | ICD-10-CM

## 2022-02-03 NOTE — Assessment & Plan Note (Signed)
Records requested today. Will plan to start Suboxone here next week once records received.

## 2022-02-03 NOTE — Assessment & Plan Note (Signed)
Working on increasing exercise

## 2022-02-04 NOTE — Patient Instructions (Addendum)
It was nice seeing you today!  Take 2 films (16 mg total) daily. Return for your follow-up appointment June 26th as scheduled.  Let me know if there are any issues getting the medication.  Let [facility] know that I am prescribing your buprenorphine.  Stay well, [name], MD [place] [phone]  --  Make sure to check out at the front desk before you leave today.  Please arrive at least 15 minutes prior to your scheduled appointments.  If you had blood work today, I will send you a MyChart message or a letter if results are normal. Otherwise, I will give you a call.  If you had a referral placed, they will call you to set up an appointment. Please give [place] a call if you don't hear back in the next 2 weeks.  If you need additional refills before your next appointment, please call your pharmacy first.

## 2022-02-04 NOTE — Progress Notes (Signed)
    SUBJECTIVE:   CHIEF COMPLAINT / HPI:  Chief Complaint  Patient presents with   Medication appt    He is here for MAT initiation. He has been receiving Subutex through [facility] since February 2022. He has been on Subutex 16 mg daily. We have received his records from [facility] last week. We have also received his labs from the health department as he is undergoing treatment for LTBI. He had been wondering about increasing his dose.  Home BP readings 120s-130s/80s typically.  He plans to visit his mother in [place] in December.  PERTINENT  PMH / PSH: SUD, depression, LTBI  Patient Care Team: [name], MD as PCP - General (Family Medicine)   OBJECTIVE:   BP (!) 149/88   Pulse 78   Ht '5\' 3"'$  (1.6 m)   Wt 244 lb 12.8 oz (111 kg)   SpO2 93%   BMI 43.36 kg/m   Physical Exam Constitutional:      General: He is not in acute distress.    Appearance: He is obese.  Cardiovascular:     Rate and Rhythm: Normal rate and regular rhythm.  Pulmonary:     Effort: Pulmonary effort is normal. No respiratory distress.     Breath sounds: Normal breath sounds.  Neurological:     Mental Status: He is alert.        02/10/2022    2:46 PM  Depression screen PHQ 2/9  Decreased Interest 0  Down, Depressed, Hopeless 0  PHQ - 2 Score 0  Altered sleeping 0  Tired, decreased energy 0  Change in appetite 0  Feeling bad or failure about yourself  0  Trouble concentrating 0  Moving slowly or fidgety/restless 0  Suicidal thoughts 0  PHQ-9 Score 0  Difficult doing work/chores Not difficult at all     {Show previous vital signs (optional):23777}    ASSESSMENT/PLAN:   Substance use disorder Discussed expectations regarding MAT treatment here. [facility] records reviewed and outside labs reviewed. Will send in 16 mg Suboxone daily until follow-up scheduled 6/26. Advised to notify [facility] once prescription obtained.  Elevated blood pressure reading Again mildly elevated in office; however, home  readings have been normal. Continue to monitor.    Return in 20 days (on 03/02/2022) for MAT.   [name], MD [place]

## 2022-02-10 ENCOUNTER — Ambulatory Visit (INDEPENDENT_AMBULATORY_CARE_PROVIDER_SITE_OTHER): Payer: Self-pay | Admitting: Family Medicine

## 2022-02-10 ENCOUNTER — Encounter: Payer: Self-pay | Admitting: Family Medicine

## 2022-02-10 ENCOUNTER — Telehealth: Payer: Self-pay | Admitting: Family Medicine

## 2022-02-10 ENCOUNTER — Telehealth: Payer: Self-pay

## 2022-02-10 VITALS — BP 149/88 | HR 78 | Ht 63.0 in | Wt 244.8 lb

## 2022-02-10 DIAGNOSIS — R030 Elevated blood-pressure reading, without diagnosis of hypertension: Secondary | ICD-10-CM

## 2022-02-10 DIAGNOSIS — F1990 Other psychoactive substance use, unspecified, uncomplicated: Secondary | ICD-10-CM

## 2022-02-10 DIAGNOSIS — R7303 Prediabetes: Secondary | ICD-10-CM

## 2022-02-10 DIAGNOSIS — E119 Type 2 diabetes mellitus without complications: Secondary | ICD-10-CM | POA: Insufficient documentation

## 2022-02-10 MED ORDER — BUPRENORPHINE HCL-NALOXONE HCL 8-2 MG SL FILM: 16.0000 mg | ORAL_FILM | Freq: Every day | SUBLINGUAL | 0 refills | Status: DC

## 2022-02-10 NOTE — Assessment & Plan Note (Signed)
Again mildly elevated in office; however, home readings have been normal. Continue to monitor.

## 2022-02-10 NOTE — Telephone Encounter (Signed)
Patients walks into [facility] reporting Suboxone is ~ 300 dollars at [employer].  Patient Eddie [name] is cheaper. I called [facility] to verify and ~150.00-170.00, however "depends."   I spoke with patient and he is ok with above estimated price.   I have cancelled prescription and [facility].   Please send to [facility].

## 2022-02-10 NOTE — Telephone Encounter (Signed)
Eddie [name] is requesting his medication prescription be sent to a different Pharmacy .  [facility] because it is cheaper ph # [phone]  address is [place] [address]

## 2022-02-10 NOTE — Assessment & Plan Note (Addendum)
Discussed expectations regarding MAT treatment here. [facility] records reviewed and outside labs reviewed. Will send in 16 mg Suboxone daily until follow-up scheduled 6/26. Advised to notify [facility] once prescription obtained.

## 2022-02-27 NOTE — Progress Notes (Signed)
    SUBJECTIVE:   CHIEF COMPLAINT / HPI:  Chief Complaint  Patient presents with   Medication Management    He reports no issues with the Suboxone.  He feels the absorption is slightly different compared to Subutex but overall feels no significant difference.  Denies any recent cravings.  He has been working on increasing exercise and has lost a few pounds since last visit.  He plays soccer and also goes on long walks with his [place], sometimes 5 to 6 miles.  He is working on cutting back on soda.  He is now working at night instead of during the day.  PERTINENT  PMH / PSH: Substance use disorder, depression, obesity  Patient Care Team: Littie Deeds, MD as PCP - General (Family Medicine)   OBJECTIVE:   BP 130/70   Pulse 72   Wt 241 lb (109.3 kg)   SpO2 98%   BMI 42.69 kg/m   Physical Exam Constitutional:      General: He is not in acute distress.    Appearance: He is obese.  Cardiovascular:     Rate and Rhythm: Normal rate and regular rhythm.  Pulmonary:     Effort: Pulmonary effort is normal. No respiratory distress.     Breath sounds: Normal breath sounds.  Neurological:     Mental Status: He is alert.         03/02/2022    2:52 PM  Depression screen PHQ 2/9  Decreased Interest 0  Down, Depressed, Hopeless 1  PHQ - 2 Score 1  Altered sleeping 0  Tired, decreased energy 0  Change in appetite 0  Feeling bad or failure about yourself  0  Trouble concentrating 0  Moving slowly or fidgety/restless 0  Suicidal thoughts 0  PHQ-9 Score 1     Wt Readings from Last 3 Encounters:  03/02/22 241 lb (109.3 kg)  02/10/22 244 lb 12.8 oz (111 kg)  02/03/22 247 lb 9.6 oz (112.3 kg)        ASSESSMENT/PLAN:   Substance use disorder Stable.  Obtained UDS.  Refilled Suboxone until next appointment in 2 weeks.  Morbid obesity (HCC) Gradually losing weight with increased exercise.    Return in 2 weeks (on 03/16/2022) for MAT.   Littie Deeds, MD [facility]  [facility]

## 2022-03-02 ENCOUNTER — Ambulatory Visit (INDEPENDENT_AMBULATORY_CARE_PROVIDER_SITE_OTHER): Payer: Self-pay | Admitting: Family Medicine

## 2022-03-02 ENCOUNTER — Encounter: Payer: Self-pay | Admitting: Family Medicine

## 2022-03-02 VITALS — BP 130/70 | HR 72 | Wt 241.0 lb

## 2022-03-02 DIAGNOSIS — F1990 Other psychoactive substance use, unspecified, uncomplicated: Secondary | ICD-10-CM

## 2022-03-02 MED ORDER — BUPRENORPHINE HCL-NALOXONE HCL 8-2 MG SL FILM: 16.0000 mg | ORAL_FILM | Freq: Every day | SUBLINGUAL | 0 refills | Status: DC

## 2022-03-02 NOTE — Assessment & Plan Note (Signed)
Gradually losing weight with increased exercise.

## 2022-03-06 LAB — TOXASSURE SELECT 13 (MW), URINE

## 2022-03-13 NOTE — Progress Notes (Unsigned)
    SUBJECTIVE:   CHIEF COMPLAINT / HPI:  No chief complaint on file.   ***  PERTINENT  PMH / PSH: ***  Patient Care Team: [name], MD as PCP - General (Family Medicine)   OBJECTIVE:   There were no vitals taken for this visit.  Physical Exam      03/02/2022    2:52 PM  Depression screen PHQ 2/9  Decreased Interest 0  Down, Depressed, Hopeless 1  PHQ - 2 Score 1  Altered sleeping 0  Tired, decreased energy 0  Change in appetite 0  Feeling bad or failure about yourself  0  Trouble concentrating 0  Moving slowly or fidgety/restless 0  Suicidal thoughts 0  PHQ-9 Score 1     {Show previous vital signs (optional):23777}  {Labs  Heme  Chem  Endocrine  Serology  Results Review (optional):23779}  ASSESSMENT/PLAN:   No problem-specific Assessment & Plan notes found for this encounter.    No follow-ups on file.   [name], MD [place]

## 2022-03-13 NOTE — Patient Instructions (Incomplete)
It was nice seeing you today!  Blood work today.  See me in 3 months or whenever is a good for you.  Stay well, Javen Ridings, MD [facility] [phone]  --  Make sure to check out at the front desk before you leave today.  Please arrive at least 15 minutes prior to your scheduled appointments.  If you had blood work today, I will send you a MyChart message or a letter if results are normal. Otherwise, I will give you a call.  If you had a referral placed, they will call you to set up an appointment. Please give us a call if you don't hear back in the next 2 weeks.  If you need additional refills before your next appointment, please call your pharmacy first.  

## 2022-03-16 ENCOUNTER — Other Ambulatory Visit: Payer: Self-pay

## 2022-03-16 ENCOUNTER — Encounter: Payer: Self-pay | Admitting: Family Medicine

## 2022-03-16 ENCOUNTER — Ambulatory Visit (INDEPENDENT_AMBULATORY_CARE_PROVIDER_SITE_OTHER): Payer: Self-pay | Admitting: Family Medicine

## 2022-03-16 VITALS — BP 147/107 | HR 71 | Ht 63.0 in | Wt 237.0 lb

## 2022-03-16 DIAGNOSIS — R030 Elevated blood-pressure reading, without diagnosis of hypertension: Secondary | ICD-10-CM

## 2022-03-16 DIAGNOSIS — Z227 Latent tuberculosis: Secondary | ICD-10-CM

## 2022-03-16 DIAGNOSIS — F1990 Other psychoactive substance use, unspecified, uncomplicated: Secondary | ICD-10-CM

## 2022-03-16 DIAGNOSIS — R7303 Prediabetes: Secondary | ICD-10-CM

## 2022-03-16 HISTORY — DX: Latent tuberculosis: Z22.7

## 2022-03-16 MED ORDER — BUPRENORPHINE HCL-NALOXONE HCL 8-2 MG SL FILM: 16.0000 mg | ORAL_FILM | Freq: Every day | SUBLINGUAL | 0 refills | Status: DC

## 2022-03-16 NOTE — Assessment & Plan Note (Signed)
Working on weight loss. Re-check A1c at next visit.

## 2022-03-16 NOTE — Assessment & Plan Note (Signed)
Elevated in office, was normotensive at last visit. Continue to monitor.

## 2022-03-16 NOTE — Assessment & Plan Note (Signed)
Stabled. Refill Suboxone until next appointment. Will space out to 1 month given stability.

## 2022-03-24 ENCOUNTER — Other Ambulatory Visit: Payer: Self-pay | Admitting: Family Medicine

## 2022-04-24 ENCOUNTER — Ambulatory Visit (INDEPENDENT_AMBULATORY_CARE_PROVIDER_SITE_OTHER): Payer: Self-pay | Admitting: Family Medicine

## 2022-04-24 ENCOUNTER — Encounter: Payer: Self-pay | Admitting: Family Medicine

## 2022-04-24 VITALS — BP 132/84 | HR 83 | Wt 237.0 lb

## 2022-04-24 DIAGNOSIS — E119 Type 2 diabetes mellitus without complications: Secondary | ICD-10-CM

## 2022-04-24 DIAGNOSIS — F1990 Other psychoactive substance use, unspecified, uncomplicated: Secondary | ICD-10-CM

## 2022-04-24 DIAGNOSIS — R7303 Prediabetes: Secondary | ICD-10-CM

## 2022-04-24 DIAGNOSIS — R030 Elevated blood-pressure reading, without diagnosis of hypertension: Secondary | ICD-10-CM

## 2022-04-24 LAB — POCT GLYCOSYLATED HEMOGLOBIN (HGB A1C): HbA1c, POC (controlled diabetic range): 6.8 % (ref 0.0–7.0)

## 2022-04-24 MED ORDER — BUPRENORPHINE HCL-NALOXONE HCL 8-2 MG SL FILM: 16.0000 mg | ORAL_FILM | Freq: Every day | SUBLINGUAL | 0 refills | Status: DC

## 2022-04-24 NOTE — Assessment & Plan Note (Signed)
Second elevated A1c greater than 6.5 confirms diagnosis of type 2 diabetes.  Discussed medication with patient, patient does not want to start at this time and would like to work on lifestyle changes which I think is reasonable. - counseled on dietary changes, recommended reducing soda/juice intake further - plan to repeat A1c in 3 months - will plan to initiate metformin if A1c > 6.5 at next re-check

## 2022-04-24 NOTE — Assessment & Plan Note (Signed)
Normotensive today and with few home readings.  We will continue to monitor, patient advised to continue monitoring at home.

## 2022-04-24 NOTE — Progress Notes (Signed)
    SUBJECTIVE:   CHIEF COMPLAINT / HPI:  Chief Complaint  Patient presents with   Medication Management    Has been stable on Suboxone 16 mg daily, here for routine follow-up.  Reports no issues.  He was tempted to take wife's Tylenol 3 when he had a knee injury 2 weeks ago but resisted.  Last A1c 7.0 in May 2023, opted to work on weight loss. He has been exercising, walking his dog. Also cutting back on soda (Sprite), now drinking 3 sodas/week. Drinks juice sometimes too.  He does not want to start medication for diabetes today and wants to continue working on lifestyle changes.  Home BP readings 120s-130s/80s. Had a single DBP 91.  PERTINENT  PMH / PSH: SUD, prediabetes  Patient Care Team: [name], MD as PCP - General (Family Medicine)   OBJECTIVE:   BP 132/84   Pulse 83   Wt 237 lb (107.5 kg)   SpO2 94%   BMI 41.98 kg/m   Physical Exam Constitutional:      General: He is not in acute distress. Cardiovascular:     Rate and Rhythm: Normal rate and regular rhythm.  Pulmonary:     Effort: Pulmonary effort is normal. No respiratory distress.     Breath sounds: Normal breath sounds.  Neurological:     Mental Status: He is alert.         04/24/2022    1:48 PM  Depression screen PHQ 2/9  Decreased Interest 0  Down, Depressed, Hopeless 0  PHQ - 2 Score 0  Altered sleeping 0  Tired, decreased energy 0  Change in appetite 0  Feeling bad or failure about yourself  0  Trouble concentrating 0  Moving slowly or fidgety/restless 0  Suicidal thoughts 0  PHQ-9 Score 0     Wt Readings from Last 3 Encounters:  04/24/22 237 lb (107.5 kg)  03/16/22 237 lb (107.5 kg)  03/02/22 241 lb (109.3 kg)        ASSESSMENT/PLAN:   Elevated blood pressure reading Normotensive today and with few home readings.  We will continue to monitor, patient advised to continue monitoring at home.  Type II diabetes mellitus (HCC) Second elevated A1c greater than 6.5 confirms  diagnosis of type 2 diabetes.  Discussed medication with patient, patient does not want to start at this time and would like to work on lifestyle changes which I think is reasonable. - counseled on dietary changes, recommended reducing soda/juice intake further - plan to repeat A1c in 3 months - will plan to initiate metformin if A1c > 6.5 at next re-check  Substance use disorder Stable.  1 month supply of Suboxone refilled.  We will follow-up in 1 month.    Return in 4 weeks (on 05/22/2022) for MAT.   [name], MD [place]

## 2022-04-24 NOTE — Patient Instructions (Addendum)
It was nice seeing you today!  Try seltzer water such as: Aha, Bubly, Polar, LaCroix.  [facility] for colonoscopy Address: [place], [place], [place] [address] Phone: [phone]  Social worker: [place] , [facility]  [phone]  Keep working on weight loss, exercising, and improving your diet. Let's re-check your A1c in 3 months.  Stay well, [name], MD [place] [phone]  --  Make sure to check out at the front desk before you leave today.  Please arrive at least 15 minutes prior to your scheduled appointments.  If you had blood work today, I will send you a MyChart message or a letter if results are normal. Otherwise, I will give you a call.  If you had a referral placed, they will call you to set up an appointment. Please give [place] a call if you don't hear back in the next 2 weeks.  If you need additional refills before your next appointment, please call your pharmacy first.

## 2022-04-24 NOTE — Assessment & Plan Note (Signed)
Stable.  1 month supply of Suboxone refilled.  We will follow-up in 1 month.

## 2022-05-04 ENCOUNTER — Other Ambulatory Visit: Payer: Self-pay

## 2022-05-15 ENCOUNTER — Other Ambulatory Visit: Payer: Self-pay

## 2022-05-18 ENCOUNTER — Other Ambulatory Visit: Payer: Self-pay

## 2022-05-18 NOTE — Telephone Encounter (Signed)
Patient's wife returns call to nurse line. She is requesting that all medications be sent to [facility] [address].   She continues to state that Suboxone is stored in a locked safe and that patient has not taken more than directed.   Pharmacy updated in chart.   Talbot Grumbling, RN

## 2022-05-18 NOTE — Telephone Encounter (Signed)
Patient's wife returns call to nurse line. She reports that patient ran out of medication yesterday.   I called the pharmacy. They dispensed a 28 day supply on 8/18.   I called [name] back to discuss further. She states that patient did not receive this quantity. She is adamant that patient has been taking as prescribed and that the pharmacy gave the incorrect amount.   Patient has a scheduled follow up on 9/15. Patient's wife states that he cannot wait until then for a refill.   She is requesting returned call from provider to discuss this further. [phone].  Talbot Grumbling, RN

## 2022-05-19 ENCOUNTER — Other Ambulatory Visit: Payer: Self-pay

## 2022-05-19 MED ORDER — BUPRENORPHINE HCL-NALOXONE HCL 8-2 MG SL FILM
16.0000 mg | ORAL_FILM | Freq: Every day | SUBLINGUAL | 0 refills | Status: DC
  Filled 2022-05-19: qty 6, 3d supply, fill #0

## 2022-05-19 NOTE — Telephone Encounter (Signed)
Patient's wife calls nurse line x 3 and left voicemails regarding rx refill. She is requesting to speak with provider about refilling medication at [facility].   Talbot Grumbling, RN

## 2022-05-19 NOTE — Addendum Note (Signed)
Addended by: [name] D on: 05/19/2022 03:44 PM   Modules accepted: Orders

## 2022-05-19 NOTE — Telephone Encounter (Signed)
Spoke with patient and his wife. He ran out of Suboxone Sunday. He did not count his pills and they think the pharmacy did not give him the correct amount. The label was correct. They are switching from [place] to [facility].  I discussed that I will give him 3 days worth of Suboxone to last until our upcoming appointment. Discussed that this will be a one-time early refill. NO more early refills.

## 2022-05-19 NOTE — Telephone Encounter (Signed)
Pt called this location and would like his medication sent to [place] at [facility]

## 2022-05-22 ENCOUNTER — Encounter: Payer: Self-pay | Admitting: Family Medicine

## 2022-05-22 ENCOUNTER — Ambulatory Visit (INDEPENDENT_AMBULATORY_CARE_PROVIDER_SITE_OTHER): Payer: Self-pay | Admitting: Family Medicine

## 2022-05-22 ENCOUNTER — Other Ambulatory Visit: Payer: Self-pay

## 2022-05-22 VITALS — BP 128/78 | HR 69 | Ht 63.0 in | Wt 234.5 lb

## 2022-05-22 DIAGNOSIS — F1990 Other psychoactive substance use, unspecified, uncomplicated: Secondary | ICD-10-CM

## 2022-05-22 DIAGNOSIS — J309 Allergic rhinitis, unspecified: Secondary | ICD-10-CM | POA: Insufficient documentation

## 2022-05-22 DIAGNOSIS — E119 Type 2 diabetes mellitus without complications: Secondary | ICD-10-CM

## 2022-05-22 MED ORDER — CETIRIZINE HCL 10 MG PO TABS
10.0000 mg | ORAL_TABLET | Freq: Every day | ORAL | 3 refills | Status: DC
  Filled 2022-05-22: qty 90, 90d supply, fill #0

## 2022-05-22 MED ORDER — BUPRENORPHINE HCL-NALOXONE HCL 8-2 MG SL FILM: 16.0000 mg | ORAL_FILM | Freq: Every day | SUBLINGUAL | 0 refills | Status: DC

## 2022-05-22 MED ORDER — BUPRENORPHINE HCL-NALOXONE HCL 8-2 MG SL FILM
16.0000 mg | ORAL_FILM | Freq: Every day | SUBLINGUAL | 0 refills | Status: DC
  Filled 2022-05-22: qty 64, 32d supply, fill #0
  Filled 2022-05-22: qty 28, 14d supply, fill #0

## 2022-05-22 MED ORDER — FLUTICASONE PROPIONATE 50 MCG/ACT NA SUSP
2.0000 | Freq: Every day | NASAL | 0 refills | Status: DC
  Filled 2022-05-22: qty 16, 30d supply, fill #0

## 2022-05-22 NOTE — Assessment & Plan Note (Signed)
Chronic, stable, medications refilled today.

## 2022-05-22 NOTE — Assessment & Plan Note (Signed)
Received an early refill of Suboxone earlier this week (patient and wife reported pharmacy gave incorrect amount). - UDS - Suboxone rx provided until follow-up appt

## 2022-05-22 NOTE — Assessment & Plan Note (Signed)
Working on dietary changes, cutting back soda.  We will plan to start medications if not controlled at next check. - urine microalbumin

## 2022-05-22 NOTE — Patient Instructions (Addendum)
It was nice seeing you today!  Keep up the good work cutting back on sodas and carbohydrates!  Try Spindrift (drink).  Follow-up as scheduled.  Stay well, [name], MD [facility] [phone]  --  Make sure to check out at the front desk before you leave today.  Please arrive at least 15 minutes prior to your scheduled appointments.  If you had blood work today, I will send you a MyChart message or a letter if results are normal. Otherwise, I will give you a call.  If you had a referral placed, they will call you to set up an appointment. Please give [place] a call if you don't hear back in the next 2 weeks.  If you need additional refills before your next appointment, please call your pharmacy first.

## 2022-05-22 NOTE — Telephone Encounter (Signed)
Patient calls nurse line regarding suboxone prescription.   [facility]   Please resend to [facility] on [address].   Medication pended to encounter.   Talbot Grumbling, RN

## 2022-05-22 NOTE — Progress Notes (Signed)
    SUBJECTIVE:   CHIEF COMPLAINT / HPI:  Chief Complaint  Patient presents with   Medication Refill    Patient here with wife. They report that his last dispense at [facility] was given in a bottle and a box but they do not think the correct amount was in there. Medications are kept in a safe. They are switching pharmacies.  When he was out of his Suboxone, he was feeling more agitated with difficulty concentrating and decreased appetite. Denies diarrhea or other side effects. No recent cravings for narcotics.  Cut back to Sprite one a day.  PERTINENT  PMH / PSH: SUD, T2DM  Patient Care Team: [name], MD as PCP - General (Family Medicine)   OBJECTIVE:   BP 128/78   Pulse 69   Ht '5\' 3"'$  (1.6 m)   Wt 234 lb 8 oz (106.4 kg)   SpO2 98%   BMI 41.54 kg/m   Physical Exam Constitutional:      General: He is not in acute distress.    Appearance: He is obese.  Cardiovascular:     Rate and Rhythm: Normal rate and regular rhythm.  Pulmonary:     Effort: Pulmonary effort is normal. No respiratory distress.     Breath sounds: Normal breath sounds.  Neurological:     Mental Status: He is alert.         05/22/2022    1:30 PM  Depression screen PHQ 2/9  Decreased Interest 0  Down, Depressed, Hopeless 1  PHQ - 2 Score 1  Altered sleeping 0  Tired, decreased energy 0  Change in appetite 0  Feeling bad or failure about yourself  0  Trouble concentrating 0  Moving slowly or fidgety/restless 0  Suicidal thoughts 0  PHQ-9 Score 1  Difficult doing work/chores Somewhat difficult     Wt Readings from Last 3 Encounters:  05/22/22 234 lb 8 oz (106.4 kg)  04/24/22 237 lb (107.5 kg)  03/16/22 237 lb (107.5 kg)        ASSESSMENT/PLAN:   Substance use disorder Received an early refill of Suboxone earlier this week (patient and wife reported pharmacy gave incorrect amount). - UDS - Suboxone rx provided until follow-up appt  Type II diabetes mellitus ([place]) Working  on dietary changes, cutting back soda.  We will plan to start medications if not controlled at next check. - urine microalbumin  Allergic rhinitis Chronic, stable, medications refilled today.   HCM - colonoscopy due, previously referred  Return in 1 month (on 06/23/2022) for MAT.   [name], MD [place]

## 2022-05-27 ENCOUNTER — Telehealth: Payer: Self-pay | Admitting: *Deleted

## 2022-05-27 NOTE — Telephone Encounter (Signed)
Wife left message on referral line requesting a referral to GI for a colonoscopy.  Will forward to MD.  Johnney Ou

## 2022-05-27 NOTE — Telephone Encounter (Signed)
He has previously been referred, please let patient/wife know they can call for appointment.  [facility] Address: [place], [place],  [address] Phone: [phone]

## 2022-05-29 ENCOUNTER — Other Ambulatory Visit: Payer: Self-pay

## 2022-05-29 NOTE — Telephone Encounter (Signed)
Lm for patient/wife about referral and left contact information for [facility].  Herrick Hartog,CMA

## 2022-06-22 NOTE — Patient Instructions (Incomplete)
It was nice seeing you today!  Keep up the good work with weight loss and exercising.  Bonnielee Haff, social worker [phone]   [facility] for [facility] Address: [place], [place], [place] [address] Phone: [phone]  See you next month.  Stay well, [name], MD [place] [phone]  --  Make sure to check out at the front desk before you leave today.  Please arrive at least 15 minutes prior to your scheduled appointments.  If you had blood work today, I will send you a MyChart message or a letter if results are normal. Otherwise, I will give you a call.  If you had a referral placed, they will call you to set up an appointment. Please give [place] a call if you don't hear back in the next 2 weeks.  If you need additional refills before your next appointment, please call your pharmacy first.  --  Therapy and Counseling Resources Most providers on this list will take Medicaid. Patients with commercial insurance or Medicare should contact their insurance company to get a list of in network providers.  [employer] [employer] (takes children) Location 1: [address] [place], [place] [address] Location 2: [place], [place] [address] [place] ([place] speaking therapist available)(habla espanol)(take medicare and medicaid)  [place], [place], [place] [address], [place] [email or web address] [phone]  [facility]:Psychiatry and Counseling [address] [place], [place] [address] [place], [place], [place] [address]      [phone]  [place] (spanish available) [place], [place] [address] [place] (take [facility] and medicare) [address] [place], [place] [address]       [phone]     [place] (virtual  only) [phone]  [facility]inny Bl[facility]om [facility] [address] [place], [place]  Family Solutions:  [place]. [place] [phone]  [facility]:  [place] [address] [phone]  [facility] (under & uninsured) [address] [place] [phone]    [email or web address]    [place] [address]  [place]    [phone]  [facility]     Phone:  [phone]     [place]  [place] [address]      [place], [place] [address] [place]: [place], [place], [place]   [place] ([place] therapist) [email or web address]  [place] [address]   [place] [address]    [phone]    [facility]   [address],  [place], [place]   [place]  [place]  [facility]  [address] [place], [place]        [phone]  Open Access/Walk In Clinic under & uninsured  [facility]  [address] [place], [place] Crisis [phone]  Family Service of the [place],  ([place])   [place], [place]: [phone]) 8:30 - 12; 1 - 2:30  Family Service of the [place],  [place]  [address], [place]    ([phone]):8:30 - 12; 2 - 3PM  [facility] [place],  [address],  [place]; [phone]):   Mon - Fri 8 AM - 5 PM  [facility] [place]  MWF 12:30 to 3:00 or call to schedule an appointment  [phone]  Specific Provider options Psychology Today  [email or web address] click on find a therapist  enter your zip code left side and  select or tailor a therapist for your specific need.   [facility] Provider Directory [email or web address]  (Medicaid)   Follow all drop down to find a provider  [place] or [email or web address] [address], [place], [place] Recovery support and educational   24- Hour Availability:   [facility]  [address] [place], [place] Crisis [phone]  Family Service of the [employer] [phone]  [place]  [phone]   [place]  [phone] (after hours)  Therapeutic Alternative/Mobile Crisis   [phone]  [place] National Suicide Hotline  [phone] [place]amantina Monks)  Call 911 or go to emergency room  [facility]  [phone]);  [place] and [employer]  [phone]); [place], [place], [place], [place], [place], [place], [place]

## 2022-06-22 NOTE — Progress Notes (Unsigned)
    SUBJECTIVE:   CHIEF COMPLAINT / HPI:  No chief complaint on file.   ***  PERTINENT  PMH / PSH: ***  Patient Care Team: [name], MD as PCP - General (Family Medicine)   OBJECTIVE:   There were no vitals taken for this visit.  Physical Exam      05/22/2022    1:30 PM  Depression screen PHQ 2/9  Decreased Interest 0  Down, Depressed, Hopeless 1  PHQ - 2 Score 1  Altered sleeping 0  Tired, decreased energy 0  Change in appetite 0  Feeling bad or failure about yourself  0  Trouble concentrating 0  Moving slowly or fidgety/restless 0  Suicidal thoughts 0  PHQ-9 Score 1  Difficult doing work/chores Somewhat difficult     {Show previous vital signs (optional):23777}  {Labs  Heme  Chem  Endocrine  Serology  Results Review (optional):23779}  ASSESSMENT/PLAN:   No problem-specific Assessment & Plan notes found for this encounter.    No follow-ups on file.   [name], MD [place]

## 2022-06-23 ENCOUNTER — Ambulatory Visit (INDEPENDENT_AMBULATORY_CARE_PROVIDER_SITE_OTHER): Payer: Self-pay | Admitting: Family Medicine

## 2022-06-23 VITALS — BP 140/106 | HR 72 | Ht 63.0 in | Wt 232.5 lb

## 2022-06-23 DIAGNOSIS — F32A Depression, unspecified: Secondary | ICD-10-CM

## 2022-06-23 DIAGNOSIS — Z23 Encounter for immunization: Secondary | ICD-10-CM

## 2022-06-23 DIAGNOSIS — J309 Allergic rhinitis, unspecified: Secondary | ICD-10-CM

## 2022-06-23 DIAGNOSIS — E119 Type 2 diabetes mellitus without complications: Secondary | ICD-10-CM

## 2022-06-23 DIAGNOSIS — Z227 Latent tuberculosis: Secondary | ICD-10-CM

## 2022-06-23 DIAGNOSIS — F1990 Other psychoactive substance use, unspecified, uncomplicated: Secondary | ICD-10-CM

## 2022-06-23 MED ORDER — FLUOXETINE HCL 40 MG PO CAPS: 40.0000 mg | ORAL_CAPSULE | Freq: Two times a day (BID) | ORAL | 0 refills | Status: DC

## 2022-06-23 MED ORDER — BUPRENORPHINE HCL-NALOXONE HCL 8-2 MG SL FILM: 16.0000 mg | ORAL_FILM | Freq: Every day | SUBLINGUAL | 0 refills | Status: DC

## 2022-06-23 MED ORDER — CETIRIZINE HCL 10 MG PO TABS: 10.0000 mg | ORAL_TABLET | Freq: Every day | ORAL | 3 refills | Status: DC

## 2022-06-23 MED ORDER — FLUTICASONE PROPIONATE 50 MCG/ACT NA SUSP: 2.0000 | Freq: Every day | NASAL | 0 refills | Status: DC

## 2022-06-23 NOTE — Assessment & Plan Note (Addendum)
Doing well on fluoxetine.  Has been hesitant to do therapy.  Resources provided today.

## 2022-06-23 NOTE — Assessment & Plan Note (Signed)
Diet controlled.  Working on weight loss, has been gradually losing weight.  We will plan to check A1c at next visit.

## 2022-06-23 NOTE — Assessment & Plan Note (Signed)
He continues to be stable on his Suboxone for MAT.  PDMP reviewed.  Refill of Suboxone provided until follow-up appointment next month.

## 2022-06-23 NOTE — Assessment & Plan Note (Signed)
Chronic, stable, medications refilled today

## 2022-07-13 ENCOUNTER — Encounter: Payer: Self-pay | Admitting: Gastroenterology

## 2022-07-20 ENCOUNTER — Encounter: Payer: Self-pay | Admitting: Family Medicine

## 2022-07-20 ENCOUNTER — Ambulatory Visit (INDEPENDENT_AMBULATORY_CARE_PROVIDER_SITE_OTHER): Payer: Self-pay | Admitting: Family Medicine

## 2022-07-20 VITALS — BP 160/104 | HR 70 | Ht 63.0 in | Wt 230.2 lb

## 2022-07-20 DIAGNOSIS — F1990 Other psychoactive substance use, unspecified, uncomplicated: Secondary | ICD-10-CM

## 2022-07-20 DIAGNOSIS — R030 Elevated blood-pressure reading, without diagnosis of hypertension: Secondary | ICD-10-CM

## 2022-07-20 DIAGNOSIS — E119 Type 2 diabetes mellitus without complications: Secondary | ICD-10-CM

## 2022-07-20 MED ORDER — BUPRENORPHINE HCL-NALOXONE HCL 8-2 MG SL FILM: 16.0000 mg | ORAL_FILM | Freq: Every day | SUBLINGUAL | 0 refills | Status: DC

## 2022-07-20 NOTE — Assessment & Plan Note (Addendum)
Diet controlled, check A1c today as well as BMP and urine microalbumin

## 2022-07-20 NOTE — Assessment & Plan Note (Signed)
Significantly elevated in office today but normal home readings.  I did discuss initiating ARB with patient but he declined.  He wishes to continue checking his blood pressure at home and will consider medication at follow-up if persistently elevated.

## 2022-07-20 NOTE — Patient Instructions (Addendum)
It was nice seeing you today!  Our goal is to keep your blood pressure under 130/80.  Continue checking your blood pressure at home and write the numbers down and bring the readings.  We will talk about starting medication if your blood pressure continues to be high. It will protect your kidneys long term.  Stay well, Eddie Button, MD [place] [phone]  --  Make sure to check out at the front desk before you leave today.  Please arrive at least 15 minutes prior to your scheduled appointments.  If you had blood work today, I will send you a MyChart message or a letter if results are normal. Otherwise, I will give you a call.  If you had a referral placed, they will call you to set up an appointment. Please give [place] a call if you don't hear back in the next 2 weeks.  If you need additional refills before your next appointment, please call your pharmacy first.

## 2022-07-20 NOTE — Assessment & Plan Note (Signed)
Continues to lose weight gradually

## 2022-07-20 NOTE — Assessment & Plan Note (Signed)
Continues to be stable on Suboxone for MAT.  Refill of Suboxone provided until follow-up appointment next month.

## 2022-07-20 NOTE — Progress Notes (Signed)
    SUBJECTIVE:   CHIEF COMPLAINT / HPI:  Chief Complaint  Patient presents with   Follow-up    He is here alone today.  He is here for refill of his Suboxone.  He is doing well on this medication, denies any recent cravings.  Home readings: 120s/80. At [facility] last week it was 135/90. He is still doing a lot of walking.  He does not want to start blood pressure medications today.  He did go to [place] to visit his sister in the interim and was there for 3 days.  Reports that she cooked a lot of [place] food which was salty.  Not planning to go to [place] this year, maybe next year.  PERTINENT  PMH / PSH: SUD on Suboxone, depression, T2DM diet controlled  Patient Care Team: [name], MD as PCP - General (Family Medicine)   OBJECTIVE:   BP (!) 160/104   Pulse 70   Ht '5\' 3"'$  (1.6 m)   Wt 230 lb 3.2 oz (104.4 kg)   SpO2 97%   BMI 40.78 kg/m   Physical Exam Constitutional:      General: He is not in acute distress.    Appearance: Normal appearance. He is obese.  Cardiovascular:     Rate and Rhythm: Normal rate and regular rhythm.  Pulmonary:     Effort: Pulmonary effort is normal. No respiratory distress.     Breath sounds: Normal breath sounds.  Neurological:     Mental Status: He is alert.         07/20/2022    8:48 AM  Depression screen PHQ 2/9  Decreased Interest 0  Down, Depressed, Hopeless 1  PHQ - 2 Score 1  Altered sleeping 0  Tired, decreased energy 0  Change in appetite 0  Feeling bad or failure about yourself  0  Trouble concentrating 0  Moving slowly or fidgety/restless 0  Suicidal thoughts 0  PHQ-9 Score 1  Difficult doing work/chores Not difficult at all     Wt Readings from Last 3 Encounters:  07/20/22 230 lb 3.2 oz (104.4 kg)  06/23/22 232 lb 8 oz (105.5 kg)  05/22/22 234 lb 8 oz (106.4 kg)        ASSESSMENT/PLAN:   Morbid obesity (HCC) Continues to lose weight gradually  Type II diabetes mellitus ([place]) Diet  controlled, check A1c today as well as BMP and urine microalbumin  Substance use disorder Continues to be stable on Suboxone for MAT.  Refill of Suboxone provided until follow-up appointment next month.  Elevated blood pressure reading Significantly elevated in office today but normal home readings.  I did discuss initiating ARB with patient but he declined.  He wishes to continue checking his blood pressure at home and will consider medication at follow-up if persistently elevated.    Return in 6 weeks (on 08/28/2022) for MAT.   [name], MD [place]

## 2022-07-21 LAB — HEMOGLOBIN A1C
Est. average glucose Bld gHb Est-mCnc: 146 mg/dL
Hgb A1c MFr Bld: 6.7 % — ABNORMAL HIGH (ref 4.8–5.6)

## 2022-07-21 LAB — MICROALBUMIN / CREATININE URINE RATIO
Creatinine, Urine: 101.5 mg/dL
Microalb/Creat Ratio: 5 mg/g creat (ref 0–29)
Microalbumin, Urine: 4.9 ug/mL

## 2022-07-21 LAB — BASIC METABOLIC PANEL
BUN/Creatinine Ratio: 15 (ref 9–20)
BUN: 10 mg/dL (ref 6–24)
CO2: 25 mmol/L (ref 20–29)
Calcium: 9.5 mg/dL (ref 8.7–10.2)
Chloride: 101 mmol/L (ref 96–106)
Creatinine, Ser: 0.66 mg/dL — ABNORMAL LOW (ref 0.76–1.27)
Glucose: 177 mg/dL — ABNORMAL HIGH (ref 70–99)
Potassium: 3.9 mmol/L (ref 3.5–5.2)
Sodium: 140 mmol/L (ref 134–144)
eGFR: 118 mL/min/{1.73_m2} (ref >59)

## 2022-07-23 ENCOUNTER — Encounter: Payer: Self-pay | Admitting: Family Medicine

## 2022-07-23 LAB — TOXASSURE SELECT 13 (MW), URINE

## 2022-07-29 ENCOUNTER — Encounter: Payer: No Typology Code available for payment source | Admitting: Gastroenterology

## 2022-08-03 ENCOUNTER — Telehealth: Payer: Self-pay

## 2022-08-03 NOTE — Telephone Encounter (Signed)
Patients wife calls nurse line requesting an apt for UTI.  She reports symptoms started "right before" Thanksgiving. She reports pain with urination and hematuria.   Denies fevers, chills. urinary frequency, abdominal pain or back pain.   Patient scheduled for tomorrow afternoon for evaluation.   ED precautions given.

## 2022-08-04 ENCOUNTER — Ambulatory Visit (INDEPENDENT_AMBULATORY_CARE_PROVIDER_SITE_OTHER): Payer: Self-pay | Admitting: Student

## 2022-08-04 VITALS — BP 128/86 | HR 92 | Ht 63.0 in | Wt 230.4 lb

## 2022-08-04 DIAGNOSIS — R319 Hematuria, unspecified: Secondary | ICD-10-CM

## 2022-08-04 HISTORY — DX: Hematuria, unspecified: R31.9

## 2022-08-04 LAB — POCT URINALYSIS DIP (MANUAL ENTRY)
Bilirubin, UA: NEGATIVE
Glucose, UA: NEGATIVE mg/dL
Ketones, POC UA: NEGATIVE mg/dL
Leukocytes, UA: NEGATIVE
Nitrite, UA: NEGATIVE
Spec Grav, UA: 1.02 (ref 1.010–1.025)
Urobilinogen, UA: 1 E.U./dL
pH, UA: 5.5 (ref 5.0–8.0)

## 2022-08-04 LAB — POCT UA - MICROSCOPIC ONLY
Bacteria, U Microscopic: NONE SEEN
WBC, Ur, HPF, POC: NONE SEEN (ref 0–5)

## 2022-08-04 NOTE — Progress Notes (Signed)
  SUBJECTIVE:   CHIEF COMPLAINT / HPI:   Hematuria: on 11/22, lifted heavy boxes (>100lbs) on his own. Later that night, he noticed dark red urine with a foamy appearance. Endorses suprapubic and testicular pain at that time. He had some burning while urinating briefly afterwards. It only happened that night twice. He drank 2 glasses of water immediately and since then his urine has been clear. He does not drink or smoke. He used to use opiates. Stopped smoking 10-15 years ago.   PERTINENT  PMH / PSH: T2DM, morbid obesity  OBJECTIVE:  BP 128/86   Pulse 92   Ht '5\' 3"'$  (1.6 m)   Wt 230 lb 6.4 oz (104.5 kg)   SpO2 97%   BMI 40.81 kg/m  General: Awake, alert, NAD CV: RRR, no murmurs auscultated Abdomen: Soft, nontender, normoactive bowel sounds, no suprapubic tenderness Back: No CVA tenderness  ASSESSMENT/PLAN:  Hematuria, unspecified type Assessment & Plan: Unknown cause, on evaluation of patient's picture that was provided it appears more discolored (orange) urine rather than gross hematuria.  Self resolved after single occurrence.  Dipstick with small blood but microscopy shows 0-2 RBCs.  Less likely to be related to malignancy.  More likely dehydration versus rhabdomyolysis versus kidney stone which regardless has resolved.  CK or kidney function testing would not be beneficial at this time as it is almost 1 week out since incident.  Advised to continue watching and should this happen again, would prompt gross hematuria evaluation.  Recommend recheck UA at next PCP visit.  Orders: -     POCT urinalysis dipstick -     POCT UA - Microscopic Only  Return if symptoms worsen or fail to improve. [name], DO 08/04/2022, 3:04 PM PGY-2, [place]

## 2022-08-04 NOTE — Assessment & Plan Note (Addendum)
Unknown cause, on evaluation of patient's picture that was provided it appears more discolored (orange) urine rather than gross hematuria.  Self resolved after single occurrence.  Dipstick with small blood but microscopy shows 0-2 RBCs.  Less likely to be related to malignancy.  More likely dehydration versus rhabdomyolysis versus kidney stone which regardless has resolved.  CK or kidney function testing would not be beneficial at this time as it is almost 1 week out since incident.  Advised to continue watching and should this happen again, would prompt gross hematuria evaluation.  Recommend recheck UA at next PCP visit.

## 2022-08-04 NOTE — Patient Instructions (Signed)
It was great to see you today! Thank you for choosing [facility] for your primary care. [facility] Eddie [name] was seen for hematuria.  Today we addressed: Hematuria: Your urine had a small amount of blood but when looked at underneath the microscope it appeared normal.  I advise you to continue watching this.  This may have been related to dehydration or perhaps you over exacerbated and had an episode of muscle breakdown or perhaps a kidney stone.  If this happens again, I would recommend extensive workup.  I will recommend to your PCP to recheck a urine at your next visit.  If you haven't already, sign up for My Chart to have easy access to your labs results, and communication with your primary care physician.  Call the clinic at [phone] if your symptoms worsen or you have any concerns.  You should return to our clinic No follow-ups on file. Please arrive 15 minutes before your appointment to ensure smooth check in process.  We appreciate your efforts in making this happen.  Thank you for allowing me to participate in your care, [name], DO 08/04/2022, 2:34 PM PGY-2, [place]

## 2022-08-12 ENCOUNTER — Encounter: Payer: Self-pay | Admitting: Gastroenterology

## 2022-08-17 ENCOUNTER — Encounter: Payer: Self-pay | Admitting: Family Medicine

## 2022-08-17 ENCOUNTER — Ambulatory Visit (INDEPENDENT_AMBULATORY_CARE_PROVIDER_SITE_OTHER): Payer: Self-pay | Admitting: Family Medicine

## 2022-08-17 VITALS — BP 158/94 | HR 88 | Temp 100.0°F | Wt 233.6 lb

## 2022-08-17 DIAGNOSIS — J189 Pneumonia, unspecified organism: Secondary | ICD-10-CM | POA: Insufficient documentation

## 2022-08-17 MED ORDER — AMOXICILLIN 500 MG PO TABS: 1000.0000 mg | ORAL_TABLET | Freq: Three times a day (TID) | ORAL | 0 refills | Status: AC

## 2022-08-17 MED ORDER — AZITHROMYCIN 250 MG PO TABS: 250.0000 mg | ORAL_TABLET | Freq: Every day | ORAL | 0 refills | Status: AC

## 2022-08-17 NOTE — Patient Instructions (Signed)
It was wonderful to meet you today. Thank you for allowing me to be a part of your care. Below is a short summary of what we discussed at your visit today:  Pneumonia I believe you have a pneumonia based on your physical exam today, your fever, and your oxygen saturation being mildly low (92%).  For 5 days, take the amoxicillin 1g three times daily and also the azithromycin 500 mg daily.  Come back Thurs or Fri if you are not seeing improvement.   Fluids: make sure your child drinks enough water or Pedialyte; for older kids Gatorade is okay too. Signs of dehydration are not making tears or urinating less than once every 8-10 hours.  Treatment:  - give 1 tablespoon of honey 3-4 times a day.  - You can also mix honey and lemon in chamomille or peppermint tea.  - You can use nasal saline to loosen nose mucus - Place a humidifier next to your bed while you sleep - If someone is taking a hot shower, let sit in the bathroom to breathe in the steam - research studies show that honey works better than cough medicine. Do not give kids cough medicine; every year in the [place] kids overdose on cough medicine.   Timeline:  - fever, runny nose, and fussiness get worse up to day 4 or 5, but then get better - it can take 2-3 weeks for cough to completely go away - cough may take weeks to resolve  Reasons to return for care include if: - is having trouble eating  - is acting very sleepy and not waking up to eat - is having trouble breathing or turns blue - is dehydrated (stops making tears or has less than 1 wet diaper every 8-10 hours)    Please bring all of your medications to every appointment!  If you have any questions or concerns, please do not hesitate to contact us via phone or MyChart message.   Ezequiel Essex, MD

## 2022-08-17 NOTE — Assessment & Plan Note (Signed)
Given rales on lung exam, low SpO2, fever, and shortness of breath will diagnose with pneumonia of LLL.  Rx amoxicillin and azithromycin.  Recommend symptomatic treatment, see AVS for more.  Return precautions given, patient to return Thursday or Friday if no improvement.  I did not offer flu or COVID testing given we are 6-7 days out from onset of symptoms and this would not change our management.

## 2022-08-17 NOTE — Progress Notes (Signed)
    SUBJECTIVE:   CHIEF COMPLAINT / HPI:   Sick visit [name] is a pleasant 46 year old man here for a febrile illness.  He reports his dog passed away last 2022/12/21 and he started feeling poorly Tuesday and Wednesday.  He thought this was initially simply due to grief, then developed worsened symptoms Thursday.  Symptoms include chest tightness and soreness, productive cough with whitish-brownish sputum, upper respiratory congestion, tactile fever with diaphoresis and chills.  Some bone ache with the fevers.  He has tried Alka-Seltzer and Tylenol with good success, however fever returns after medication wears off.  Denies nausea and vomiting.  Eating and drinking normally.  Normal UOP.  No known lung disorders.  S/p COVID vaccines x4 S/p flu vaccine 06/23/22  His wife was experiencing similar symptoms last week, she was diagnosed with bronchitis.  She did not undergo flu or COVID testing.  PERTINENT  PMH / PSH: T2DM, elevated BP, H/L splenectomy, depression  OBJECTIVE:   BP (!) 158/94   Pulse 88   Temp 100 F (37.8 C) (Oral)   Wt 233 lb 9.6 oz (106 kg)   SpO2 92%   BMI 41.38 kg/m    PHQ-9:     08/17/2022    2:54 PM 08/04/2022    2:03 PM 07/20/2022    8:48 AM  Depression screen PHQ 2/9  Decreased Interest 1 0 0  Down, Depressed, Hopeless 0 0 1  PHQ - 2 Score 1 0 1  Altered sleeping 0 0 0  Tired, decreased energy 0 0 0  Change in appetite 0 0 0  Feeling bad or failure about yourself  0 0 0  Trouble concentrating 0 0 0  Moving slowly or fidgety/restless 0 0 0  Suicidal thoughts 0 0 0  PHQ-9 Score 1 0 1  Difficult doing work/chores   Not difficult at all    Physical Exam General: Awake, alert, oriented, mild distress HEENT: PERRL, bilateral TM pearly pink and flat, bilateral external auditory canals with minimal cerumen burden, no lesions, nasal mucosa slightly edematous, oral mucosa pink, moist, without lesion, intact dentition without obvious cavity,  tonsils moderately enlarged with tonsil stone visible on left Lymph: No palpable lymphedema of head or neck Cardiovascular: Regular rate and rhythm, S1 and S2 present, no murmurs auscultated Respiratory: Rales present on inspiration and expiration over posterior left lower lobe, otherwise lungs clear anterior/posterior  ASSESSMENT/PLAN:   Pneumonia of left lower lobe due to infectious organism Given rales on lung exam, low SpO2, fever, and shortness of breath will diagnose with pneumonia of LLL.  Rx amoxicillin and azithromycin.  Recommend symptomatic treatment, see AVS for more.  Return precautions given, patient to return Thursday or Friday if no improvement.  I did not offer flu or COVID testing given we are 6-7 days out from onset of symptoms and this would not change our management.    Eddie Essex, MD [place]

## 2022-08-25 ENCOUNTER — Ambulatory Visit: Payer: Self-pay | Admitting: *Deleted

## 2022-08-25 ENCOUNTER — Other Ambulatory Visit: Payer: Self-pay

## 2022-08-25 VITALS — Ht 63.0 in | Wt 240.0 lb

## 2022-08-25 DIAGNOSIS — Z1211 Encounter for screening for malignant neoplasm of colon: Secondary | ICD-10-CM

## 2022-08-25 MED ORDER — NA SULFATE-K SULFATE-MG SULF 17.5-3.13-1.6 GM/177ML PO SOLN: 1.0000 | Freq: Once | ORAL | 0 refills | Status: AC

## 2022-08-25 NOTE — Progress Notes (Signed)
Pre visit completed over telephone with patient and wife.  Level of understanding was questionable. Patient awaiting Medicaid approval, understands if insurance cannot be verified one week before that procedure will be cancelled. Instructions mailed.  No egg or soy allergy known to patient  No issues known to pt with past sedation with any surgeries or procedures Patient denies ever being told they had issues or difficulty with intubation  No FH of Malignant Hyperthermia Pt is not on diet pills Pt is not on  home 02  Pt is not on blood thinners  Pt denies issues with constipation  No A fib or A flutter  Pt instructed to use Singlecare.com or GoodRx for a price reduction on prep

## 2022-08-28 ENCOUNTER — Encounter: Payer: Self-pay | Admitting: Family Medicine

## 2022-08-28 ENCOUNTER — Ambulatory Visit (INDEPENDENT_AMBULATORY_CARE_PROVIDER_SITE_OTHER): Payer: Self-pay | Admitting: Family Medicine

## 2022-08-28 VITALS — BP 151/104 | HR 67 | Ht 64.0 in | Wt 227.0 lb

## 2022-08-28 DIAGNOSIS — F1990 Other psychoactive substance use, unspecified, uncomplicated: Secondary | ICD-10-CM

## 2022-08-28 DIAGNOSIS — R030 Elevated blood-pressure reading, without diagnosis of hypertension: Secondary | ICD-10-CM

## 2022-08-28 MED ORDER — BUPRENORPHINE HCL-NALOXONE HCL 8-2 MG SL FILM: 16.0000 mg | ORAL_FILM | Freq: Every day | SUBLINGUAL | 0 refills | Status: DC

## 2022-08-28 NOTE — Patient Instructions (Addendum)
It was nice seeing you today!  Keep up the good work with exercise and dietary changes! Try to check your blood pressure at least a few times a week and write them down below.  Stay well, [name], MD [place] [phone]  --  Make sure to check out at the front desk before you leave today.  Please arrive at least 15 minutes prior to your scheduled appointments.  If you had blood work today, I will send you a MyChart message or a letter if results are normal. Otherwise, I will give you a call.  If you had a referral placed, they will call you to set up an appointment. Please give [place] a call if you don't hear back in the next 2 weeks.  If you need additional refills before your next appointment, please call your pharmacy first.  -- Blood Pressure Record Sheet To take your blood pressure, you will need a blood pressure machine. You can buy a blood pressure machine (blood pressure monitor) at your clinic, drug store, or online. When choosing one, consider: An automatic monitor that has an arm cuff. A cuff that wraps snugly around your upper arm. You should be able to fit only one finger between your arm and the cuff. A device that stores blood pressure reading results. Do not choose a monitor that measures your blood pressure from your wrist or finger. Follow your health care provider's instructions for how to take your blood pressure. To use this form: Take your blood pressure medications every day These measurements should be taken when you have been at rest for at least 10-15 min Take at least 2 readings with each blood pressure check. This makes sure the results are correct. Wait 1-2 minutes between measurements. Write down the results in the spaces on this form. Keep in mind it should always be recorded systolic over diastolic. Both numbers are important.  Repeat this every day for 2-3 weeks, or as told by your health care provider.  Make a follow-up  appointment with your health care provider to discuss the results.  Blood Pressure Log Date Medications taken? (Y/N) Blood Pressure Time of Day

## 2022-08-28 NOTE — Assessment & Plan Note (Signed)
Continues to be stable on Suboxone for MAT.  Refilled Suboxone until follow-up in 1 month.

## 2022-08-28 NOTE — Assessment & Plan Note (Signed)
Elevated in office today but patient reports normal ambulatory readings. - discussed initiating ARB, patient declined at this time - advised to monitor BP at home - will revisit ARB at future visits

## 2022-08-28 NOTE — Progress Notes (Signed)
    SUBJECTIVE:   CHIEF COMPLAINT / HPI:  Chief Complaint  Patient presents with   Follow-up    Patient is here for routine follow-up for MAT.  He continues to do well on Suboxone daily.  His dog died earlier this month and was feeling a bit more depressed then but is doing better now.  Since my last visit with him, he was seen in the office for a sick visit diagnosed with pneumonia presumptively treated with antibiotics.  He reports he is fully recovered now.  He was also seen in the office for concern for hematuria.  UA showed small blood but 0-2 RBCs on microscopy.  He reports he has not had any further episodes of hematuria since that visit.  He has continued to work on weight loss with exercise.  He has been reducing his soda intake.  He is working on cutting back on sugar in his coffee.  He has not been checking his blood pressure as often lately but reports that they have been normal usually in the 120s over 80s.  PERTINENT  PMH / PSH: SUD on Suboxone, depression, T2DM diet controlled  Patient Care Team: [name], MD as PCP - General (Family Medicine)   OBJECTIVE:   BP (!) 151/104   Pulse 67   Ht '5\' 4"'$  (1.626 m)   Wt 227 lb (103 kg)   SpO2 99%   BMI 38.96 kg/m   Physical Exam Constitutional:      General: He is not in acute distress. Cardiovascular:     Rate and Rhythm: Normal rate and regular rhythm.  Pulmonary:     Effort: Pulmonary effort is normal. No respiratory distress.     Breath sounds: Normal breath sounds.  Neurological:     Mental Status: He is alert.         08/28/2022    8:47 AM  Depression screen PHQ 2/9  Decreased Interest 1  Down, Depressed, Hopeless 0  PHQ - 2 Score 1  Altered sleeping 0  Tired, decreased energy 0  Change in appetite 0  Feeling bad or failure about yourself  0  Trouble concentrating 0  Moving slowly or fidgety/restless 0  Suicidal thoughts 0  PHQ-9 Score 1  Difficult doing work/chores Not difficult at all      Wt Readings from Last 3 Encounters:  08/28/22 227 lb (103 kg)  08/25/22 240 lb (108.9 kg)  08/17/22 233 lb 9.6 oz (106 kg)        ASSESSMENT/PLAN:   Substance use disorder Continues to be stable on Suboxone for MAT.  Refilled Suboxone until follow-up in 1 month.  Elevated blood pressure reading Elevated in office today but patient reports normal ambulatory readings. - discussed initiating ARB, patient declined at this time - advised to monitor BP at home - will revisit ARB at future visits    Return in about 4 weeks (around 09/25/2022) for f/u MAT.   [name], MD [place]

## 2022-09-03 ENCOUNTER — Encounter: Payer: No Typology Code available for payment source | Admitting: Gastroenterology

## 2022-09-22 ENCOUNTER — Encounter: Payer: No Typology Code available for payment source | Admitting: Gastroenterology

## 2022-09-22 ENCOUNTER — Telehealth: Payer: Self-pay | Admitting: Gastroenterology

## 2022-09-22 NOTE — Telephone Encounter (Signed)
Good Afternoon Dr. Candis Schatz,  I called this patient at 12:45pm today to see if he was coming for his procedure his wife answered the phone and stated she would have to cancel I did not get a reason before the call dropped.  I tried to call back and did not get any answer.    I will NO SHOW patient.

## 2022-09-24 ENCOUNTER — Other Ambulatory Visit: Payer: Self-pay | Admitting: Family Medicine

## 2022-09-28 NOTE — Patient Instructions (Addendum)
It was nice seeing you today!  Try Voltaren gel for the elbow.  Stay well, [name], MD [place] [phone]  --  Make sure to check out at the front desk before you leave today.  Please arrive at least 15 minutes prior to your scheduled appointments.  If you had blood work today, I will send you a MyChart message or a letter if results are normal. Otherwise, I will give you a call.  If you had a referral placed, they will call you to set up an appointment. Please give [place] a call if you don't hear back in the next 2 weeks.  If you need additional refills before your next appointment, please call your pharmacy first.

## 2022-09-28 NOTE — Progress Notes (Signed)
SUBJECTIVE:   CHIEF COMPLAINT / HPI:  Chief Complaint  Patient presents with   1 mo f/u    Here with wife today.  He reports that he has been doing well on the Suboxone without any issues.  He did have a blood pressure reading 140/80s at home but otherwise reports that his blood pressure readings are typically within normal range.  He still does not want to start antihypertensive medication today.  He reports that he has multiple skin tags that are painful and would like these removed.  PERTINENT  PMH / PSH: SUD on Suboxone, depression, T2DM diet controlled  Patient Care Team: [name], MD as PCP - General (Family Medicine)   OBJECTIVE:   BP (!) 150/105   Pulse 74   Ht '5\' 4"'$  (1.626 m)   Wt 232 lb 3.2 oz (105.3 kg)   SpO2 95%   BMI 39.86 kg/m   Physical Exam Constitutional:      General: He is not in acute distress. Cardiovascular:     Rate and Rhythm: Normal rate and regular rhythm.     Heart sounds: Normal heart sounds.  Pulmonary:     Effort: Pulmonary effort is normal. No respiratory distress.     Breath sounds: Normal breath sounds.  Skin:    Comments: Multiple pedunculated exophytic flesh-colored masses consistent with skin tags, 3 lesions noted in the right axillary region, 4 noted in the right side of the neck, 8 noted in the left axillary region, 11 noted in the left side of the neck.  Neurological:     Mental Status: He is alert.         09/29/2022    8:56 AM  Depression screen PHQ 2/9  Decreased Interest 0  Down, Depressed, Hopeless 1  PHQ - 2 Score 1  Altered sleeping 0  Tired, decreased energy 0  Change in appetite 0  Feeling bad or failure about yourself  0  Trouble concentrating 1  Moving slowly or fidgety/restless 1  Suicidal thoughts 0  PHQ-9 Score 3  Difficult doing work/chores Somewhat difficult    Cryotherapy Procedure Note  Pre-operative Diagnosis: Acrochordon  Post-operative Diagnosis: Same  Locations: Left axilla,  right axilla, left-sided neck, right-sided neck  Indications: Pain  Anesthesia: not required     Procedure Details  History of allergy to iodine: no. Pacemaker? no.  Patient informed of risks (permanent scarring, infection, light or dark discoloration, bleeding, infection, weakness, numbness and recurrence of the lesion) and benefits of the procedure and written informed consent obtained.  The areas are treated with liquid nitrogen therapy, frozen until ice ball extended 3 mm beyond lesion, allowed to thaw, and treated again for total of 3 treatments for each lesion. The patient tolerated procedure well.  The patient was instructed on post-op care, warned that there may be blister formation, redness and pain. Recommend OTC analgesia as needed for pain.  Condition: Stable  Complications: none.    {Show previous vital signs (optional):23777}    ASSESSMENT/PLAN:   Substance use disorder Chronic, stable.  Suboxone refilled until follow-up next month.  Elevated blood pressure reading Remains elevated in office but patient reporting normal ambulatory readings mostly. 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  Treated with cryotherapy as above given that lesions are painful.  Return in 1 month (on 11/02/2022) for f/u MAT.   [name], MD [place] [name]  [name]

## 2022-09-29 ENCOUNTER — Ambulatory Visit (INDEPENDENT_AMBULATORY_CARE_PROVIDER_SITE_OTHER): Payer: Self-pay | Admitting: Family Medicine

## 2022-09-29 ENCOUNTER — Encounter: Payer: Self-pay | Admitting: Family Medicine

## 2022-09-29 VITALS — BP 150/105 | HR 74 | Ht 64.0 in | Wt 232.2 lb

## 2022-09-29 DIAGNOSIS — R030 Elevated blood-pressure reading, without diagnosis of hypertension: Secondary | ICD-10-CM

## 2022-09-29 DIAGNOSIS — F1990 Other psychoactive substance use, unspecified, uncomplicated: Secondary | ICD-10-CM

## 2022-09-29 DIAGNOSIS — L918 Other hypertrophic disorders of the skin: Secondary | ICD-10-CM

## 2022-09-29 MED ORDER — BUPRENORPHINE HCL-NALOXONE HCL 8-2 MG SL FILM: 16.0000 mg | ORAL_FILM | Freq: Every day | SUBLINGUAL | 0 refills | Status: DC

## 2022-09-29 NOTE — Assessment & Plan Note (Signed)
Remains elevated in office but patient reporting normal ambulatory readings mostly. - patient continues to decline ARB

## 2022-09-29 NOTE — Assessment & Plan Note (Signed)
Chronic, stable.  Suboxone refilled until follow-up next month. 

## 2022-10-23 ENCOUNTER — Ambulatory Visit: Payer: Self-pay | Admitting: *Deleted

## 2022-10-23 VITALS — Ht 64.0 in | Wt 230.0 lb

## 2022-10-23 DIAGNOSIS — Z1211 Encounter for screening for malignant neoplasm of colon: Secondary | ICD-10-CM

## 2022-10-23 MED ORDER — NA SULFATE-K SULFATE-MG SULF 17.5-3.13-1.6 GM/177ML PO SOLN: 1.0000 | Freq: Once | ORAL | 0 refills | Status: AC

## 2022-10-23 NOTE — Progress Notes (Signed)
Pre visit conducted over telephone assisted by patient's wife  Instructions sent through secure e-mail and mailed to home address.    No egg or soy allergy known to patient  No issues known to pt with past sedation with any surgeries or procedures Patient denies ever being told they had issues or difficulty with intubation  No FH of Malignant Hyperthermia Pt is not on diet pills Pt is not on  home 02  Pt is not on blood thinners  Pt denies issues with constipation  No A fib or A flutter Have any cardiac testing pending-  NO Pt instructed to use [email or web address] or GoodRx for a price reduction on prep

## 2022-11-02 ENCOUNTER — Ambulatory Visit (INDEPENDENT_AMBULATORY_CARE_PROVIDER_SITE_OTHER): Payer: Self-pay | Admitting: Family Medicine

## 2022-11-02 ENCOUNTER — Encounter: Payer: Self-pay | Admitting: Family Medicine

## 2022-11-02 VITALS — BP 130/91 | HR 71 | Ht 64.0 in | Wt 233.1 lb

## 2022-11-02 DIAGNOSIS — E119 Type 2 diabetes mellitus without complications: Secondary | ICD-10-CM

## 2022-11-02 DIAGNOSIS — R030 Elevated blood-pressure reading, without diagnosis of hypertension: Secondary | ICD-10-CM

## 2022-11-02 DIAGNOSIS — F1990 Other psychoactive substance use, unspecified, uncomplicated: Secondary | ICD-10-CM

## 2022-11-02 LAB — POCT GLYCOSYLATED HEMOGLOBIN (HGB A1C): HbA1c, POC (controlled diabetic range): 6.4 % (ref 0.0–7.0)

## 2022-11-02 MED ORDER — BUPRENORPHINE HCL-NALOXONE HCL 8-2 MG SL FILM: 16.0000 mg | ORAL_FILM | Freq: Every day | SUBLINGUAL | 0 refills | Status: DC

## 2022-11-02 NOTE — Assessment & Plan Note (Signed)
Chronic, stable.  Suboxone refilled until follow-up next month.

## 2022-11-02 NOTE — Progress Notes (Signed)
    SUBJECTIVE:   CHIEF COMPLAINT / HPI:  No chief complaint on file.   Finally got insurance. He has an upcoming colonoscopy next week.  Doing well with Suboxone without any issues. Two of his wife's friends passed away last week from drug overdose.  Home readings 120s-140s/100s.  He forgot to bring his BP log.  PERTINENT  PMH / PSH: SUD on Suboxone, depression, T2DM diet controlled  Patient Care Team: [name], MD as PCP - General (Family Medicine)   OBJECTIVE:   BP (!) 130/91   Pulse 71   Ht 5' 4"$  (1.626 m)   Wt 233 lb 2 oz (105.7 kg)   SpO2 100%   BMI 40.02 kg/m   Physical Exam Constitutional:      General: He is not in acute distress.    Appearance: He is obese.  Cardiovascular:     Rate and Rhythm: Normal rate and regular rhythm.  Pulmonary:     Effort: Pulmonary effort is normal. No respiratory distress.     Breath sounds: Normal breath sounds.  Neurological:     Mental Status: He is alert.         11/02/2022   11:19 AM  Depression screen PHQ 2/9  Decreased Interest 1  Down, Depressed, Hopeless 0  PHQ - 2 Score 1  Altered sleeping 0  Tired, decreased energy 0  Change in appetite 0  Feeling bad or failure about yourself  0  Trouble concentrating 0  Moving slowly or fidgety/restless 0  Suicidal thoughts 0  PHQ-9 Score 1  Difficult doing work/chores Somewhat difficult     {Show previous vital signs (optional):23777}  Last hemoglobin A1c Lab Results  Component Value Date   HGBA1C 6.4 11/02/2022     ASSESSMENT/PLAN:   Type II diabetes mellitus (HCC) Diet-controlled, A1c improving.  Substance use disorder Chronic, stable.  Suboxone refilled until follow-up next month.  Elevated blood pressure reading Mildly elevated on repeat today.  Would recommend low-dose ARB, patient prefers to continue lifestyle modifications.    Return in 30 days (on 12/02/2022) for f/u MAT.   [name], MD [place]

## 2022-11-02 NOTE — Assessment & Plan Note (Signed)
Diet-controlled, A1c improving.

## 2022-11-02 NOTE — Assessment & Plan Note (Signed)
Mildly elevated on repeat today.  Would recommend low-dose ARB, patient prefers to continue lifestyle modifications.

## 2022-11-02 NOTE — Patient Instructions (Addendum)
It was nice seeing you today!  Continue with regular exercise at least 150 minutes per week.  Try to avoid processed foods (canned foods, frozen foods, and restaurant foods).  Stay well, [name], MD [place] [phone]  --  Make sure to check out at the front desk before you leave today.  Please arrive at least 15 minutes prior to your scheduled appointments.  If you had blood work today, I will send you a MyChart message or a letter if results are normal. Otherwise, I will give you a call.  If you had a referral placed, they will call you to set up an appointment. Please give [place] a call if you don't hear back in the next 2 weeks.  If you need additional refills before your next appointment, please call your pharmacy first.  -- Blood Pressure Record Sheet To take your blood pressure, you will need a blood pressure machine. You can buy a blood pressure machine (blood pressure monitor) at your clinic, drug store, or online. When choosing one, consider: An automatic monitor that has an arm cuff. A cuff that wraps snugly around your upper arm. You should be able to fit only one finger between your arm and the cuff. A device that stores blood pressure reading results. Do not choose a monitor that measures your blood pressure from your wrist or finger. Follow your health care provider's instructions for how to take your blood pressure. To use this form: Take your blood pressure medications every day These measurements should be taken when you have been at rest for at least 10-15 min Take at least 2 readings with each blood pressure check. This makes sure the results are correct. Wait 1-2 minutes between measurements. Write down the results in the spaces on this form. Keep in mind it should always be recorded systolic over diastolic. Both numbers are important.  Repeat this every day for 2-3 weeks, or as told by your health care provider.  Make a  follow-up appointment with your health care provider to discuss the results.  Blood Pressure Log Date Medications taken? (Y/N) Blood Pressure Time of Day

## 2022-11-06 ENCOUNTER — Telehealth: Payer: Self-pay | Admitting: *Deleted

## 2022-11-06 MED ORDER — NA SULFATE-K SULFATE-MG SULF 17.5-3.13-1.6 GM/177ML PO SOLN: 1.0000 | Freq: Once | ORAL | 0 refills | Status: AC

## 2022-11-06 NOTE — Telephone Encounter (Signed)
Pt's wife came stating that medication was not at pharmacy. RN ordered medication and sent it  to the pharmacy that wife approved.

## 2022-11-10 ENCOUNTER — Encounter: Payer: Self-pay | Admitting: Gastroenterology

## 2022-11-10 ENCOUNTER — Ambulatory Visit: Payer: Commercial Managed Care - HMO | Admitting: Gastroenterology

## 2022-11-10 VITALS — BP 126/82 | HR 62 | Temp 98.0°F | Resp 20 | Ht 64.0 in | Wt 230.0 lb

## 2022-11-10 DIAGNOSIS — K5140 Inflammatory polyps of colon without complications: Secondary | ICD-10-CM | POA: Diagnosis not present

## 2022-11-10 DIAGNOSIS — D124 Benign neoplasm of descending colon: Secondary | ICD-10-CM | POA: Diagnosis not present

## 2022-11-10 DIAGNOSIS — Z1211 Encounter for screening for malignant neoplasm of colon: Secondary | ICD-10-CM

## 2022-11-10 MED ORDER — SODIUM CHLORIDE 0.9 % IV SOLN: 500.0000 mL | INTRAVENOUS | Status: DC

## 2022-11-10 NOTE — Progress Notes (Signed)
Called to room to assist during endoscopic procedure.  Patient ID and intended procedure confirmed with present staff. Received instructions for my participation in the procedure from the performing physician.  

## 2022-11-10 NOTE — Progress Notes (Signed)
Uneventful anesthetic. Report to pacu rn. Vss. Care resumed by rn.

## 2022-11-10 NOTE — Op Note (Signed)
[facility] Patient Name: Eddie [name] Procedure Date: 11/10/2022 3:16 PM MRN: [id number] Endoscopist: Nicki Reaper E. Candis Schatz , MD, [id number] Age: 47 Referring MD:  Date of Birth: 1976-06-19 Gender: Male Account #: [id number] Procedure:                Colonoscopy Indications:              Screening for colorectal malignant neoplasm, This                            is the patient's first colonoscopy Medicines:                Monitored Anesthesia Care Procedure:                Pre-Anesthesia Assessment:                           - Prior to the procedure, a History and Physical                            was performed, and patient medications and                            allergies were reviewed. The patient's tolerance of                            previous anesthesia was also reviewed. The risks                            and benefits of the procedure and the sedation                            options and risks were discussed with the patient.                            All questions were answered, and informed consent                            was obtained. Prior Anticoagulants: The patient has                            taken no anticoagulant or antiplatelet agents. ASA                            Grade Assessment: II - A patient with mild systemic                            disease. After reviewing the risks and benefits,                            the patient was deemed in satisfactory condition to                            undergo the procedure.  After obtaining informed consent, the colonoscope                            was passed under direct vision. Throughout the                            procedure, the patient's blood pressure, pulse, and                            oxygen saturations were monitored continuously. The                            CF [id number] was introduced through the anus                            and advanced to  the the terminal ileum, with                            identification of the appendiceal orifice and IC                            valve. The colonoscopy was performed without                            difficulty. The patient tolerated the procedure                            well. The quality of the bowel preparation was                            good. The terminal ileum, ileocecal valve,                            appendiceal orifice, and rectum were photographed.                            The bowel preparation used was SUPREP via split                            dose instruction. Scope In: 3:28:21 PM Scope Out: 3:45:03 PM Scope Withdrawal Time: 0 hours 13 minutes 15 seconds  Total Procedure Duration: 0 hours 16 minutes 42 seconds  Findings:                 The perianal and digital rectal examinations were                            normal. Pertinent negatives include normal                            sphincter tone and no palpable rectal lesions.                           A 4 mm polyp was found in the descending colon. The  polyp was sessile. The polyp was removed with a                            cold snare. Resection and retrieval were complete.                            Estimated blood loss was minimal.                           A few small-mouthed diverticula were found in the                            sigmoid colon and descending colon. There was no                            evidence of diverticular bleeding.                           The exam was otherwise normal throughout the                            examined colon.                           The terminal ileum appeared normal.                           The retroflexed view of the distal rectum and anal                            verge was normal and showed no anal or rectal                            abnormalities. Complications:            No immediate complications. Estimated Blood Loss:      Estimated blood loss was minimal. Impression:               - One 4 mm polyp in the descending colon, removed                            with a cold snare. Resected and retrieved.                           - Mild diverticulosis in the sigmoid colon and in                            the descending colon. There was no evidence of                            diverticular bleeding.                           - The examined portion of the ileum was normal.                           -  The distal rectum and anal verge are normal on                            retroflexion view. Recommendation:           - Patient has a contact number available for                            emergencies. The signs and symptoms of potential                            delayed complications were discussed with the                            patient. Return to normal activities tomorrow.                            Written discharge instructions were provided to the                            patient.                           - Resume previous diet.                           - Continue present medications.                           - Await pathology results.                           - Repeat colonoscopy (date not yet determined) for                            surveillance based on pathology results. Orel Hord E. Candis Schatz, MD 11/10/2022 3:49:20 PM This report has been signed electronically.

## 2022-11-10 NOTE — Progress Notes (Signed)
Pt's states no medical or surgical changes since previsit or office visit. 

## 2022-11-10 NOTE — Patient Instructions (Signed)
Handout on polyps and diverticulosis given to patient.  Await pathology results.  Resume previous diet and continue present medications.  Repeat colonoscopy for surveillance will be determined based off of pathology results.   YOU HAD AN ENDOSCOPIC PROCEDURE TODAY AT [facility]:   Refer to the procedure report that was given to you for any specific questions about what was found during the examination.  If the procedure report does not answer your questions, please call your gastroenterologist to clarify.  If you requested that your care partner not be given the details of your procedure findings, then the procedure report has been included in a sealed envelope for you to review at your convenience later.  YOU SHOULD EXPECT: Some feelings of bloating in the abdomen. Passage of more gas than usual.  Walking can help get rid of the air that was put into your GI tract during the procedure and reduce the bloating. If you had a lower endoscopy (such as a colonoscopy or flexible sigmoidoscopy) you may notice spotting of blood in your stool or on the toilet paper. If you underwent a bowel prep for your procedure, you may not have a normal bowel movement for a few days.  Please Note:  You might notice some irritation and congestion in your nose or some drainage.  This is from the oxygen used during your procedure.  There is no need for concern and it should clear up in a day or so.  SYMPTOMS TO REPORT IMMEDIATELY:  Following lower endoscopy (colonoscopy or flexible sigmoidoscopy):  Excessive amounts of blood in the stool  Significant tenderness or worsening of abdominal pains  Swelling of the abdomen that is new, acute  Fever of 100F or higher  For urgent or emergent issues, a gastroenterologist can be reached at any hour by calling [phone]. Do not use MyChart messaging for urgent concerns.    DIET:  We do recommend a small meal at first, but then you may proceed to your  regular diet.  Drink plenty of fluids but you should avoid alcoholic beverages for 24 hours.  ACTIVITY:  You should plan to take it easy for the rest of today and you should NOT DRIVE or use heavy machinery until tomorrow (because of the sedation medicines used during the test).    FOLLOW UP: Our staff will call the number listed on your records the next business day following your procedure.  We will call around 7:15- 8:00 am to check on you and address any questions or concerns that you may have regarding the information given to you following your procedure. If we do not reach you, we will leave a message.     If any biopsies were taken you will be contacted by phone or by letter within the next 1-3 weeks.  Please call us at [phone] if you have not heard about the biopsies in 3 weeks.    SIGNATURES/CONFIDENTIALITY: You and/or your care partner have signed paperwork which will be entered into your electronic medical record.  These signatures attest to the fact that that the information above on your After Visit Summary has been reviewed and is understood.  Full responsibility of the confidentiality of this discharge information lies with you and/or your care-partner.

## 2022-11-10 NOTE — Progress Notes (Signed)
[facility] History and Physical   Primary Care Physician:  [name], MD   Reason for Procedure:   Colon cancer screening  Plan:    Screening colonoscopy     HPI: [facility] Eddie [name] is a 47 y.o. male undergoing initial average risk screening colonoscopy.  He has no family history of colon cancer and no chronic GI symptoms.    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   Neuropathy 10/15/2017    Past Surgical History:  Procedure Laterality Date   HERNIA REPAIR     SPLENECTOMY, TOTAL      Prior to Admission medications   Medication Sig Start Date End Date Taking? Authorizing Provider  Buprenorphine HCl-Naloxone HCl 8-2 MG FILM Place 2 Film under the tongue daily. 11/02/22 12/03/22 Yes [name], MD  FLUoxetine (PROZAC) 40 MG capsule Take 1 capsule by mouth twice daily 09/24/22  Yes [name], MD  cetirizine (ZYRTEC) 10 MG tablet Take 1 tablet (10 mg total) by mouth at bedtime for nasal congestion. 06/23/22   [name], MD  fluticasone (FLONASE) 50 MCG/ACT nasal spray Place 2 sprays into both nostrils daily. 06/23/22   [name], MD    Current Outpatient Medications  Medication Sig Dispense Refill   Buprenorphine HCl-Naloxone HCl 8-2 MG FILM Place 2 Film under the tongue daily. 62 Film 0   FLUoxetine (PROZAC) 40 MG capsule Take 1 capsule by mouth twice daily 180 capsule 0   cetirizine (ZYRTEC) 10 MG tablet Take 1 tablet (10 mg total) by mouth at bedtime for nasal congestion. 90 tablet 3   fluticasone (FLONASE) 50 MCG/ACT nasal spray Place 2 sprays into both nostrils daily. 16 g 0   Current Facility-Administered Medications  Medication Dose Route Frequency Provider Last Rate Last Admin   0.9 %  sodium chloride infusion  500 mL Intravenous Continuous Daryel November, MD        Allergies as  of 11/10/2022   (No Known Allergies)    Family History  Problem Relation Age of Onset   Diabetes Mother    Hypertension Mother    Diabetes Father    Hypertension Father    Colon cancer Neg Hx     Social History   Socioeconomic History   Marital status: Married    Spouse name: Not on file   Number of children: Not on file   Years of education: Not on file   Highest education level: Not on file  Occupational History   Not on file  Tobacco Use   Smoking status: Never   Smokeless tobacco: Never  Substance and Sexual Activity   Alcohol use: Never   Drug use: Never   Sexual activity: Not on file  Other Topics Concern   Not on file  Social History Narrative   Not on file   Social Determinants of Health   Financial Resource Strain: High Risk (09/10/2020)   Overall Financial Resource Strain (CARDIA)    Difficulty of Paying Living Expenses: Very hard  Food Insecurity: Food Insecurity Present (09/10/2020)   Hunger Vital Sign    Worried About Running Out of Food in the Last Year: Sometimes true    Ran Out of Food in the Last Year: Sometimes true  Transportation Needs: Not on file  Physical Activity: Not on file  Stress: Stress Concern Present (09/10/2020)  [place] Stress Questionnaire    Feeling of Stress : Very much  Social Connections: Not on file  Intimate Partner Violence: Not on file    Review of Systems:  All other review of systems negative except as mentioned in the HPI.  Physical Exam: Vital signs BP (!) 148/98   Pulse 69   Temp 98 F (36.7 C) (Temporal)   Ht '5\' 4"'$  (1.626 m)   Wt 230 lb (104.3 kg)   SpO2 98%   BMI 39.48 kg/m   General:   Alert,  Well-developed, well-nourished, pleasant and cooperative in NAD Airway:  Mallampati 2 Lungs:  Clear throughout to auscultation.   Heart:  Regular rate and rhythm; no murmurs, clicks, rubs,  or gallops. Abdomen:  Soft, nontender and nondistended. Normal bowel  sounds.   Neuro/Psych:  Normal mood and affect. A and O x 3   Herschell Virani E. Candis Schatz, MD [facility]

## 2022-11-11 ENCOUNTER — Telehealth: Payer: Self-pay | Admitting: *Deleted

## 2022-11-11 NOTE — Telephone Encounter (Signed)
Post procedure follow up call placed, no answer and left VM.  

## 2022-11-16 ENCOUNTER — Encounter: Payer: Self-pay | Admitting: Gastroenterology

## 2022-11-30 ENCOUNTER — Encounter: Payer: Self-pay | Admitting: Family Medicine

## 2022-11-30 NOTE — Progress Notes (Unsigned)
    SUBJECTIVE:   CHIEF COMPLAINT / HPI:  No chief complaint on file.   Patient reports he is doing well on his Suboxone.  Denies any side effects from the medication. Needs refills for cetirizine and Flonase.  He recently had his colonoscopy which was ultimately normal, repeat in 10 years. He reports his blood pressure was 120s/70s when he had his vitals checked.  PERTINENT  PMH / PSH: SUD on Suboxone, depression, T2DM diet controlled  Patient Care Team: [name], MD as PCP - General (Family Medicine)   OBJECTIVE:   BP (!) 163/100   Pulse 72   Ht 5\' 4"  (1.626 m)   Wt 232 lb 2 oz (105.3 kg)   SpO2 94%   BMI 39.84 kg/m   Physical Exam Constitutional:      General: He is not in acute distress.    Appearance: He is obese.  Cardiovascular:     Rate and Rhythm: Normal rate and regular rhythm.  Pulmonary:     Effort: Pulmonary effort is normal. No respiratory distress.     Breath sounds: Normal breath sounds.  Neurological:     Mental Status: He is alert.         12/02/2022   10:26 AM  Depression screen PHQ 2/9  Decreased Interest 0  Down, Depressed, Hopeless 1  PHQ - 2 Score 1  Altered sleeping 0  Tired, decreased energy 0  Change in appetite 0  Feeling bad or failure about yourself  0  Trouble concentrating 0  Moving slowly or fidgety/restless 0  Suicidal thoughts 0  PHQ-9 Score 1  Difficult doing work/chores Not difficult at all     {Show previous vital signs (optional):23777}    ASSESSMENT/PLAN:   Substance use disorder Chronic, stable.  Suboxone refilled until follow-up next month.  Elevated blood pressure reading Elevated in office but reports normal ambulatory blood pressure readings.  He has been hesitant to start antihypertensive.  I gave him a blood pressure log and advised him to check once a day.  Will strongly recommend ARB at follow-up if ambulatory readings are elevated.    Return in 30 days (on 01/01/2023) for Follow-up MAT.    [name], MD [place]

## 2022-11-30 NOTE — Patient Instructions (Incomplete)
It was nice seeing you today!    Stay well, Littie Deeds, MD [facility] [phone]  --  Make sure to check out at the front desk before you leave today.  Please arrive at least 15 minutes prior to your scheduled appointments.  If you had blood work today, I will send you a MyChart message or a letter if results are normal. Otherwise, I will give you a call.  If you had a referral placed, they will call you to set up an appointment. Please give [place] a call if you don't hear back in the next 2 weeks.  If you need additional refills before your next appointment, please call your pharmacy first.  -- Blood Pressure Record Sheet To take your blood pressure, you will need a blood pressure machine. You can buy a blood pressure machine (blood pressure monitor) at your clinic, drug store, or online. When choosing one, consider: An automatic monitor that has an arm cuff. A cuff that wraps snugly around your upper arm. You should be able to fit only one finger between your arm and the cuff. A device that stores blood pressure reading results. Do not choose a monitor that measures your blood pressure from your wrist or finger. Follow your health care provider's instructions for how to take your blood pressure. To use this form: Take your blood pressure medications every day These measurements should be taken when you have been at rest for at least 10-15 min Take at least 2 readings with each blood pressure check. This makes sure the results are correct. Wait 1-2 minutes between measurements. Write down the results in the spaces on this form. Keep in mind it should always be recorded systolic over diastolic. Both numbers are important.  Repeat this every day for 2-3 weeks, or as told by your health care provider.  Make a follow-up appointment with your health care provider to discuss the results.  Blood Pressure Log Date Medications taken? (Y/N) Blood Pressure Time of  Day

## 2022-12-02 ENCOUNTER — Ambulatory Visit (INDEPENDENT_AMBULATORY_CARE_PROVIDER_SITE_OTHER): Payer: Commercial Managed Care - HMO | Admitting: Family Medicine

## 2022-12-02 ENCOUNTER — Encounter: Payer: Self-pay | Admitting: Family Medicine

## 2022-12-02 VITALS — BP 163/100 | HR 72 | Ht 64.0 in | Wt 232.1 lb

## 2022-12-02 DIAGNOSIS — J309 Allergic rhinitis, unspecified: Secondary | ICD-10-CM | POA: Diagnosis not present

## 2022-12-02 DIAGNOSIS — R030 Elevated blood-pressure reading, without diagnosis of hypertension: Secondary | ICD-10-CM | POA: Diagnosis not present

## 2022-12-02 DIAGNOSIS — F1990 Other psychoactive substance use, unspecified, uncomplicated: Secondary | ICD-10-CM | POA: Diagnosis not present

## 2022-12-02 MED ORDER — CETIRIZINE HCL 10 MG PO TABS: 10.0000 mg | ORAL_TABLET | Freq: Every day | ORAL | 3 refills | Status: DC

## 2022-12-02 MED ORDER — FLUTICASONE PROPIONATE 50 MCG/ACT NA SUSP: 2.0000 | Freq: Every day | NASAL | 11 refills | Status: DC

## 2022-12-02 MED ORDER — BUPRENORPHINE HCL-NALOXONE HCL 8-2 MG SL FILM: 16.0000 mg | ORAL_FILM | Freq: Every day | SUBLINGUAL | 0 refills | Status: DC

## 2022-12-02 NOTE — Assessment & Plan Note (Signed)
Elevated in office but reports normal ambulatory blood pressure readings.  He has been hesitant to start antihypertensive.  I gave him a blood pressure log and advised him to check once a day.  Will strongly recommend ARB at follow-up if ambulatory readings are elevated.

## 2022-12-02 NOTE — Assessment & Plan Note (Signed)
Chronic, stable.  Suboxone refilled until follow-up next month. 

## 2023-01-01 ENCOUNTER — Encounter: Payer: Self-pay | Admitting: Family Medicine

## 2023-01-01 ENCOUNTER — Telehealth: Payer: Self-pay

## 2023-01-01 ENCOUNTER — Ambulatory Visit (INDEPENDENT_AMBULATORY_CARE_PROVIDER_SITE_OTHER): Payer: Commercial Managed Care - HMO | Admitting: Family Medicine

## 2023-01-01 VITALS — BP 157/105 | HR 71 | Ht 64.0 in | Wt 235.2 lb

## 2023-01-01 DIAGNOSIS — F32A Depression, unspecified: Secondary | ICD-10-CM

## 2023-01-01 DIAGNOSIS — F1990 Other psychoactive substance use, unspecified, uncomplicated: Secondary | ICD-10-CM | POA: Diagnosis not present

## 2023-01-01 DIAGNOSIS — R030 Elevated blood-pressure reading, without diagnosis of hypertension: Secondary | ICD-10-CM

## 2023-01-01 MED ORDER — BUPRENORPHINE HCL-NALOXONE HCL 8-2 MG SL FILM: 16.0000 mg | ORAL_FILM | Freq: Every day | SUBLINGUAL | 0 refills | Status: DC

## 2023-01-01 MED ORDER — FLUOXETINE HCL 40 MG PO CAPS: 40.0000 mg | ORAL_CAPSULE | Freq: Every day | ORAL | 1 refills | Status: DC

## 2023-01-01 NOTE — Assessment & Plan Note (Signed)
Seems to be doing well.  Will trial lower dose fluoxetine.

## 2023-01-01 NOTE — Progress Notes (Signed)
    SUBJECTIVE:   CHIEF COMPLAINT / HPI:  Chief Complaint  Patient presents with   F/u MAT    Reports home readings 120s/90s.  He does not want to start on blood pressure medication.  No issues with the Suboxone.  He did have some left knee pain for a few days which is now resolved.  He ran out of the fluoxetine a few days ago but has not had any problems.  He would like to try a lower dose.  PERTINENT  PMH / PSH: SUD on Suboxone, depression, T2DM diet controlled  Patient Care Team: Littie Deeds, MD as PCP - General (Family Medicine)   OBJECTIVE:   BP (!) 157/105   Pulse 71   Ht 5\' 4"  (1.626 m)   Wt 235 lb 3.2 oz (106.7 kg)   SpO2 98%   BMI 40.37 kg/m   Physical Exam Constitutional:      General: He is not in acute distress.    Appearance: He is obese.  Cardiovascular:     Rate and Rhythm: Normal rate and regular rhythm.  Pulmonary:     Effort: Pulmonary effort is normal. No respiratory distress.     Breath sounds: Normal breath sounds.  Neurological:     Mental Status: He is alert.         01/01/2023   10:06 AM  Depression screen PHQ 2/9  Decreased Interest 1  Down, Depressed, Hopeless 0  PHQ - 2 Score 1  Altered sleeping 0  Tired, decreased energy 0  Change in appetite 0  Feeling bad or failure about yourself  0  Trouble concentrating 0  Moving slowly or fidgety/restless 0  Suicidal thoughts 0  PHQ-9 Score 1  Difficult doing work/chores Somewhat difficult           ASSESSMENT/PLAN:   Problem List Items Addressed This Visit       Other   Depression    Seems to be doing well.  Will trial lower dose fluoxetine.      Relevant Medications   FLUoxetine (PROZAC) 40 MG capsule   Substance use disorder - Primary    Chronic, stable.  Suboxone refilled until follow-up next month.      Relevant Medications   Buprenorphine HCl-Naloxone HCl 8-2 MG FILM   Other Relevant Orders   ToxASSURE Select 13 (MW), Urine   Elevated blood pressure reading     Continue to be elevated in office, home diastolic readings appear to be elevated as well.  I did strongly recommend starting antihypertensive again today but he is hesitant.  I recommended that he schedule a visit in pharmacy clinic for 24-hour ambulatory blood pressure monitoring.         Return in 1 month (on 02/02/2023) for f/u MAT.   Littie Deeds, MD [facility]

## 2023-01-01 NOTE — Assessment & Plan Note (Signed)
Continue to be elevated in office, home diastolic readings appear to be elevated as well.  I did strongly recommend starting antihypertensive again today but he is hesitant.  I recommended that he schedule a visit in pharmacy clinic for 24-hour ambulatory blood pressure monitoring.

## 2023-01-01 NOTE — Telephone Encounter (Signed)
Patient LVM on nurse line regarding prescriptions from today's visit. VM was unclear if there was an issue with the prescription or if meds needed to be transferred.   Attempted to return call to patient. He did not answer, LVM asking that he return call to office.   Veronda Prude, RN

## 2023-01-01 NOTE — Assessment & Plan Note (Signed)
Chronic, stable.  Suboxone refilled until follow-up next month. 

## 2023-01-01 NOTE — Patient Instructions (Addendum)
It was nice seeing you today!  Decrease your fluoxetine to 40 mg once a day.  Schedule visit with Dr. Raymondo Band with the pharmacy clinic for 24 hour blood pressure monitoring.  Stay well, Eddie Deeds, MD [facility] [phone]  --  Make sure to check out at the front desk before you leave today.  Please arrive at least 15 minutes prior to your scheduled appointments.  If you had blood work today, I will send you a MyChart message or a letter if results are normal. Otherwise, I will give you a call.  If you had a referral placed, they will call you to set up an appointment. Please give [place] a call if you don't hear back in the next 2 weeks.  If you need additional refills before your next appointment, please call your pharmacy first.

## 2023-01-07 LAB — TOXASSURE SELECT 13 (MW), URINE

## 2023-02-02 ENCOUNTER — Ambulatory Visit (INDEPENDENT_AMBULATORY_CARE_PROVIDER_SITE_OTHER): Payer: Commercial Managed Care - HMO | Admitting: Family Medicine

## 2023-02-02 ENCOUNTER — Other Ambulatory Visit: Payer: Self-pay | Admitting: Family Medicine

## 2023-02-02 ENCOUNTER — Encounter: Payer: Self-pay | Admitting: Family Medicine

## 2023-02-02 VITALS — BP 118/84 | HR 75 | Ht 64.0 in | Wt 233.0 lb

## 2023-02-02 DIAGNOSIS — F1990 Other psychoactive substance use, unspecified, uncomplicated: Secondary | ICD-10-CM | POA: Diagnosis not present

## 2023-02-02 DIAGNOSIS — E119 Type 2 diabetes mellitus without complications: Secondary | ICD-10-CM | POA: Diagnosis not present

## 2023-02-02 LAB — POCT GLYCOSYLATED HEMOGLOBIN (HGB A1C): HbA1c, POC (controlled diabetic range): 6.5 % (ref 0.0–7.0)

## 2023-02-02 MED ORDER — BUPRENORPHINE HCL-NALOXONE HCL 8-2 MG SL FILM: 16.0000 mg | ORAL_FILM | Freq: Every day | SUBLINGUAL | 0 refills | Status: DC

## 2023-02-02 MED ORDER — SEMAGLUTIDE(0.25 OR 0.5MG/DOS) 2 MG/1.5ML ~~LOC~~ SOPN: 0.2500 mg | PEN_INJECTOR | SUBCUTANEOUS | 0 refills | Status: DC

## 2023-02-02 NOTE — Patient Instructions (Addendum)
It was nice seeing you today!  Start Ozempic 0.25 mg once a week.  Stay well, Littie Deeds, MD [facility] [phone]  --  Make sure to check out at the front desk before you leave today.  Please arrive at least 15 minutes prior to your scheduled appointments.  If you had blood work today, I will send you a MyChart message or a letter if results are normal. Otherwise, I will give you a call.  If you had a referral placed, they will call you to set up an appointment. Please give [place] a call if you don't hear back in the next 2 weeks.  If you need additional refills before your next appointment, please call your pharmacy first.

## 2023-02-02 NOTE — Assessment & Plan Note (Signed)
A1c is stable and at goal, he is still diet controlled.  He is interested in GLP-1 agonist so we will start semaglutide starting dose, hopefully he will be able to afford this.

## 2023-02-02 NOTE — Progress Notes (Signed)
    SUBJECTIVE:   CHIEF COMPLAINT / HPI:  Chief Complaint  Patient presents with   Diabetes    He reports that he is doing well on the Suboxone without any issues.  At last visit, urine toxicology was positive for alprazolam.  I discussed this with the patient.  He denies any benzodiazepine use.  He reports he was prescribed this many years ago by previous doctor but did not like the way it made him feel.  His wife is prescribed Klonopin but he reports he does not take any of his wife's medications.  Patient was asking about starting Ozempic for his diabetes and to help with weight loss.  He has some concerns about the cost, reports that he will not pick up the medication if it cost too much.  PERTINENT  PMH / PSH: SUD on Suboxone, depression, T2DM diet controlled  Patient Care Team: Littie Deeds, MD as PCP - General (Family Medicine)   OBJECTIVE:   BP 118/84   Pulse 75   Ht 5\' 4"  (1.626 m)   Wt 233 lb (105.7 kg)   SpO2 95%   BMI 39.99 kg/m   Physical Exam Constitutional:      General: He is not in acute distress. Cardiovascular:     Rate and Rhythm: Normal rate and regular rhythm.  Pulmonary:     Effort: Pulmonary effort is normal. No respiratory distress.     Breath sounds: Normal breath sounds.  Neurological:     Mental Status: He is alert.         02/02/2023   10:05 AM  Depression screen PHQ 2/9  Decreased Interest 0  Down, Depressed, Hopeless 1  PHQ - 2 Score 1  Altered sleeping 0  Tired, decreased energy 0  Change in appetite 0  Feeling bad or failure about yourself  0  Trouble concentrating 0  Moving slowly or fidgety/restless 0  Suicidal thoughts 0  PHQ-9 Score 1     {Show previous vital signs (optional):23777}  Last hemoglobin A1c Lab Results  Component Value Date   HGBA1C 6.5 02/02/2023      ASSESSMENT/PLAN:   Substance use disorder Doing well on Suboxone.  Recent urine toxicology positive for alprazolam, patient adamantly denies use.   It could be possible that this is a false positive from his fluoxetine.  Will repeat urine toxicology today.  Suboxone refilled until follow-up next month.  Type II diabetes mellitus (HCC) A1c is stable and at goal, he is still diet controlled.  He is interested in GLP-1 agonist so we will start semaglutide starting dose, hopefully he will be able to afford this.    Return in 27 days (on 03/01/2023) for f/u MAT.   Littie Deeds, MD [facility]

## 2023-02-02 NOTE — Assessment & Plan Note (Signed)
Doing well on Suboxone.  Recent urine toxicology positive for alprazolam, patient adamantly denies use.  It could be possible that this is a false positive from his fluoxetine.  Will repeat urine toxicology today.  Suboxone refilled until follow-up next month.

## 2023-02-04 ENCOUNTER — Telehealth: Payer: Self-pay

## 2023-02-04 ENCOUNTER — Other Ambulatory Visit (HOSPITAL_COMMUNITY): Payer: Self-pay

## 2023-02-04 DIAGNOSIS — E119 Type 2 diabetes mellitus without complications: Secondary | ICD-10-CM

## 2023-02-04 NOTE — Telephone Encounter (Signed)
A Prior Authorization was initiated for this patients OZEMPIC through CoverMyMeds.   Insurance has patients name as Eddie [name]  Key: [id number]

## 2023-02-05 LAB — TOXASSURE SELECT 13 (MW), URINE

## 2023-02-05 NOTE — Telephone Encounter (Signed)
Prior Auth for patients medication OZEMPIC denied by [employer] via CoverMyMeds.   Reason:   CoverMyMeds Key: [id number]

## 2023-02-09 MED ORDER — TRULICITY 0.75 MG/0.5ML ~~LOC~~ SOAJ: 0.7500 mg | SUBCUTANEOUS | 2 refills | Status: DC

## 2023-02-09 NOTE — Addendum Note (Signed)
Addended by: Littie Deeds D on: 02/09/2023 02:23 PM   Modules accepted: Orders

## 2023-02-09 NOTE — Telephone Encounter (Signed)
Ozempic PA denied.  Will try Trulicity which appears to be a covered alternative.

## 2023-03-01 ENCOUNTER — Ambulatory Visit (INDEPENDENT_AMBULATORY_CARE_PROVIDER_SITE_OTHER): Payer: Commercial Managed Care - HMO | Admitting: Family Medicine

## 2023-03-01 ENCOUNTER — Encounter: Payer: Self-pay | Admitting: Family Medicine

## 2023-03-01 VITALS — BP 135/88 | HR 72 | Ht 64.0 in | Wt 227.0 lb

## 2023-03-01 DIAGNOSIS — F32A Depression, unspecified: Secondary | ICD-10-CM | POA: Diagnosis not present

## 2023-03-01 DIAGNOSIS — F1990 Other psychoactive substance use, unspecified, uncomplicated: Secondary | ICD-10-CM

## 2023-03-01 DIAGNOSIS — E119 Type 2 diabetes mellitus without complications: Secondary | ICD-10-CM | POA: Diagnosis not present

## 2023-03-01 DIAGNOSIS — J309 Allergic rhinitis, unspecified: Secondary | ICD-10-CM

## 2023-03-01 DIAGNOSIS — R030 Elevated blood-pressure reading, without diagnosis of hypertension: Secondary | ICD-10-CM

## 2023-03-01 MED ORDER — BUPRENORPHINE HCL-NALOXONE HCL 8-2 MG SL FILM
16.0000 mg | ORAL_FILM | Freq: Every day | SUBLINGUAL | 0 refills | Status: DC
Start: 2023-03-01 — End: 2023-03-30

## 2023-03-01 MED ORDER — FLUTICASONE PROPIONATE 50 MCG/ACT NA SUSP: 2.0000 | Freq: Every day | NASAL | 11 refills | Status: DC

## 2023-03-01 MED ORDER — FLUOXETINE HCL 40 MG PO CAPS: 40.0000 mg | ORAL_CAPSULE | Freq: Every day | ORAL | 1 refills | Status: DC

## 2023-03-01 MED ORDER — TRULICITY 0.75 MG/0.5ML ~~LOC~~ SOAJ: 0.7500 mg | SUBCUTANEOUS | 2 refills | Status: DC

## 2023-03-01 NOTE — Assessment & Plan Note (Signed)
Initially elevated today, but improved on repeat.  Reports normal blood pressure readings.  I advised that he have his blood pressure checked every day and to bring a log at his next visit.

## 2023-03-01 NOTE — Progress Notes (Signed)
SUBJECTIVE:   CHIEF COMPLAINT / HPI:    Here with wife today.  He reports he is continuing to do well on the Suboxone.  He denies any recent cravings.  He currently denies any outside substance use.  His wife keeps her medications locked up separately from his.  At last visit, I tried to send in Ozempic for his diabetes and to help with weight loss efforts.  This was denied, so I sent in Trulicity.  However he has not heard anything about this prescription.  He would like to try this and have this medication resent to his pharmacy.  Wife reports that she checks his blood pressure every day, denies any high readings.  Typically in the 110s-120s/80s.  79 year old son recently moved back in with him which has been good, but stressful.  They had to euthanize his cat which was a major financial strain.  PERTINENT  PMH / PSH: SUD on Suboxone, depression, T2DM  Patient Care Team: Littie Deeds, MD as PCP - General (Family Medicine)   OBJECTIVE:   BP 135/88   Pulse 72   Ht 5\' 4"  (1.626 m)   Wt 227 lb (103 kg)   SpO2 98%   BMI 38.96 kg/m   Physical Exam Constitutional:      General: He is not in acute distress. HENT:     Head: Normocephalic and atraumatic.  Cardiovascular:     Rate and Rhythm: Normal rate and regular rhythm.  Pulmonary:     Effort: Pulmonary effort is normal. No respiratory distress.     Breath sounds: Normal breath sounds.  Neurological:     Mental Status: He is alert.         03/01/2023   10:17 AM  Depression screen PHQ 2/9  Decreased Interest 0  Down, Depressed, Hopeless 1  PHQ - 2 Score 1  Altered sleeping 0  Tired, decreased energy 0  Change in appetite 0  Feeling bad or failure about yourself  0  Trouble concentrating 0  Moving slowly or fidgety/restless 0  Suicidal thoughts 0  PHQ-9 Score 1     Wt Readings from Last 3 Encounters:  03/01/23 227 lb (103 kg)  02/02/23 233 lb (105.7 kg)  01/01/23 235 lb 3.2 oz (106.7 kg)         ASSESSMENT/PLAN:   Problem List Items Addressed This Visit       Respiratory   Allergic rhinitis    Chronic, stable.  Flonase refilled.      Relevant Medications   fluticasone (FLONASE) 50 MCG/ACT nasal spray     Endocrine   Type II diabetes mellitus (HCC)   Relevant Medications   Dulaglutide (TRULICITY) 0.75 MG/0.5ML SOPN     Other   Depression    Stable, refilled fluoxetine today.      Relevant Medications   FLUoxetine (PROZAC) 40 MG capsule   Morbid obesity (HCC)   Relevant Medications   Dulaglutide (TRULICITY) 0.75 MG/0.5ML SOPN   Substance use disorder - Primary    He has remained stable on Suboxone.  Discussed urine toxicology positive for oxycodone at last visit, patient is adamant against use.  I think this could possibly be a false positive. - scheduled f/u with new PCP in 1 month on 7/23, he will continue to need monthly follow-up appointments for MAT going forward - Suboxone refilled until follow-up, #29 prescribed-going forward, he can continue Suboxone refills until next appointments or can just refill monthly depending on PCP preference -  repeat Utox today      Relevant Medications   Buprenorphine HCl-Naloxone HCl 8-2 MG FILM   Other Relevant Orders   ToxASSURE Select 13 (MW), Urine   Elevated blood pressure reading    Initially elevated today, but improved on repeat.  Reports normal blood pressure readings.  I advised that he have his blood pressure checked every day and to bring a log at his next visit.         Return in 29 days (on 03/30/2023) for f/u SUD.   Littie Deeds, MD [facility]

## 2023-03-01 NOTE — Patient Instructions (Addendum)
It was nice seeing you today!  Continue checking your blood pressure at home regularly.  Stay well, Littie Deeds, MD [facility] [phone]  --  Make sure to check out at the front desk before you leave today.  Please arrive at least 15 minutes prior to your scheduled appointments.  If you had blood work today, I will send you a MyChart message or a letter if results are normal. Otherwise, I will give you a call.  If you had a referral placed, they will call you to set up an appointment. Please give [place] a call if you don't hear back in the next 2 weeks.  If you need additional refills before your next appointment, please call your pharmacy first.  - Blood Pressure Record Sheet To take your blood pressure, you will need a blood pressure machine. You can buy a blood pressure machine (blood pressure monitor) at your clinic, drug store, or online. When choosing one, consider: An automatic monitor that has an arm cuff. A cuff that wraps snugly around your upper arm. You should be able to fit only one finger between your arm and the cuff. A device that stores blood pressure reading results. Do not choose a monitor that measures your blood pressure from your wrist or finger. Follow your health care provider's instructions for how to take your blood pressure. To use this form: Take your blood pressure medications every day These measurements should be taken when you have been at rest for at least 10-15 min Take at least 2 readings with each blood pressure check. This makes sure the results are correct. Wait 1-2 minutes between measurements. Write down the results in the spaces on this form. Keep in mind it should always be recorded systolic over diastolic. Both numbers are important.  Repeat this every day for 2-3 weeks, or as told by your health care provider.  Make a follow-up appointment with your health care provider to discuss the results.  Blood Pressure  Log Date Medications taken? (Y/N) Blood Pressure Time of Day

## 2023-03-01 NOTE — Assessment & Plan Note (Signed)
Chronic, stable.  Flonase refilled.

## 2023-03-01 NOTE — Assessment & Plan Note (Signed)
He has remained stable on Suboxone.  Discussed urine toxicology positive for oxycodone at last visit, patient is adamant against use.  I think this could possibly be a false positive. - scheduled f/u with new PCP in 1 month on 7/23, he will continue to need monthly follow-up appointments for MAT going forward - Suboxone refilled until follow-up, #29 prescribed-going forward, he can continue Suboxone refills until next appointments or can just refill monthly depending on PCP preference - repeat Utox today

## 2023-03-01 NOTE — Assessment & Plan Note (Signed)
Stable, refilled fluoxetine today.

## 2023-03-04 ENCOUNTER — Other Ambulatory Visit (HOSPITAL_COMMUNITY): Payer: Self-pay

## 2023-03-04 ENCOUNTER — Telehealth: Payer: Self-pay

## 2023-03-04 LAB — TOXASSURE SELECT 13 (MW), URINE

## 2023-03-04 NOTE — Telephone Encounter (Signed)
A Prior Authorization was initiated for this patients TRULICITY through CoverMyMeds.   (Insurance has patient name as Eddie [name])  Key: [id number]

## 2023-03-05 NOTE — Telephone Encounter (Signed)
Prior Auth for patients medication TRULICITY denied by [employer] via CoverMyMeds.   Reason: There is no indication that your patient has met both of the following:  A) Individual will continue maximally tolerated metformin therapy, if not contraindicated per FDA label, intolerant, or otherwise not a candidate; and  B) Documentation of one of the following: 1. Unable to achieve goal HbA1C despite metformin or metformin-containing regimen (meglitinides, sulfonylureas, or thiazolidinediones) at greater than or equal to 1,500 mg per day; 2. Intolerance to metformin 1,500 mg per day despite appropriate dose titration duration (for example, period of 8-12 weeks); 3. Contraindication to metformin per FDA label (for example, acute/chronic metabolic acidosis, severe renal dysfunction); 4. Not a candidate for metformin (for example, hepatic impairment, moderate renal dysfunction, unstable heart failure, individual is using an agent for a non-diabetic FDAapproved indication); 5. Initial metformin combination therapy is clinically appropriate for elevated HbA1C (for example; greater than 1.5% above goal); or 6. Initial metformin combination therapy is clinically appropriate in an individual with co-morbid conditions (such as ASCVD, heart failure, or CKD).   Bydureon, Byetta and Trulicity are medically necessary when all of the following criteria are met (1 and 2): 1. Diagnosis of type 2 diabetes mellitus; and 2. Both of the following are met (A and B): A. Documented one of the following: i. Unable to achieve goal HbA1C despite metformin or metformin-containing regimen (meglitinides, sulfonylureas, or thiazolidinediones) at greater than or equal to 1,500 mg per day; ii. Intolerance to metformin 1,500 mg per day despite appropriate dose   CoverMyMeds Key: [id number] PA Case ID: [id number]

## 2023-03-29 NOTE — Progress Notes (Signed)
    SUBJECTIVE:   CHIEF COMPLAINT / HPI: follow-up  Blood pressure - Log, patient states he forgot to bring in blood pressure log but that at home systolics between 120 and 130, diastolics 80s to 90s blood pressure.  Patient to try diet and lifestyle modifications  SUD - Taking 8-2 Suboxone BID.  Feels this is helping him accomplish his ADLs.  Denies constipation.  T2DM - Prior auth denied Trulicity. Last A1c 6.5 05/28.  Wishes to try diet and lifestyle modifications before starting metformin.  PERTINENT  PMH / PSH: T2DM, SUD  OBJECTIVE:   BP (!) 133/106   Pulse 84   Ht 5\' 4"  (1.626 m)   Wt 220 lb 6.4 oz (100 kg)   SpO2 96%   BMI 37.83 kg/m   General: NAD  Neuro: A&O Cardiovascular: RRR, no murmurs, no peripheral edema Respiratory: normal WOB on RA, CTAB, no wheezes, ronchi or rales Extremities: Moving all 4 extremities equally    ASSESSMENT/PLAN:   Substance use disorder Assessment & Plan: Remained stable on Suboxone 8-2 twice daily.  PDMP reviewed without concerns.  Prescribed 30-day supply to next appointment with me on 08/21, will repeat urine drug screen today.  Orders: -     Buprenorphine HCl-Naloxone HCl; Place 2 Film under the tongue daily.  Dispense: 60 Film; Refill: 0 -     ToxASSURE Select 13 (MW), Urine  Depression, unspecified depression type Assessment & Plan: Patient requesting refill, PHQ-9 score 3.  Orders: -     FLUoxetine HCl; Take 1 capsule (40 mg total) by mouth daily.  Dispense: 90 capsule; Refill: 1  Elevated blood pressure reading Assessment & Plan: Elevated x 2 today.  Patient does not wish to start blood pressure medication at this time.  Discussed that this is now 2 clinic visits neuro with elevated blood pressures, and I recommend starting either calcium channel blocker or ARB.  Patient would like to trial lifestyle modifications first but amenable to starting medication if still elevated at next visit in 1 month.  Did not wish to do  ambulatory blood pressure monitoring with Dr. Raymondo Band.   Type 2 diabetes mellitus without complication, without long-term current use of insulin (HCC) Assessment & Plan: Last A1c 6.5 Trulicity and Ozempic prior Auth denied.  Patient would like to trial lifestyle modifications including his home exercise regimen and diet before starting metformin.  Will recheck A1c next visit in 1 month.    Return in about 1 month (around 04/30/2023).  Celine Mans, MD [facility]

## 2023-03-30 ENCOUNTER — Ambulatory Visit (INDEPENDENT_AMBULATORY_CARE_PROVIDER_SITE_OTHER): Payer: Commercial Managed Care - HMO | Admitting: Family Medicine

## 2023-03-30 ENCOUNTER — Encounter: Payer: Self-pay | Admitting: Family Medicine

## 2023-03-30 VITALS — BP 133/106 | HR 84 | Ht 64.0 in | Wt 220.4 lb

## 2023-03-30 DIAGNOSIS — E119 Type 2 diabetes mellitus without complications: Secondary | ICD-10-CM | POA: Diagnosis not present

## 2023-03-30 DIAGNOSIS — R030 Elevated blood-pressure reading, without diagnosis of hypertension: Secondary | ICD-10-CM | POA: Diagnosis not present

## 2023-03-30 DIAGNOSIS — F1990 Other psychoactive substance use, unspecified, uncomplicated: Secondary | ICD-10-CM

## 2023-03-30 DIAGNOSIS — F32A Depression, unspecified: Secondary | ICD-10-CM

## 2023-03-30 MED ORDER — FLUOXETINE HCL 40 MG PO CAPS: 40.0000 mg | ORAL_CAPSULE | Freq: Every day | ORAL | 1 refills | Status: DC

## 2023-03-30 MED ORDER — BUPRENORPHINE HCL-NALOXONE HCL 8-2 MG SL FILM
16.0000 mg | ORAL_FILM | Freq: Every day | SUBLINGUAL | 0 refills | Status: DC
Start: 2023-03-30 — End: 2023-04-28

## 2023-03-30 NOTE — Patient Instructions (Addendum)
It was great to see you! Thank you for allowing me to participate in your care!  Our plans for today:  - Continue to take Suboxone.  - Your blood pressure is slightly elevated, this is the second time. If it is elevated next time we will need to take medications. - Please continue your exercise and diet modifications.   Please arrive 15 minutes PRIOR to your next scheduled appointment time! If you do not, this affects OTHER patients' care.  Take care and seek immediate care sooner if you develop any concerns.   Celine Mans, MD, PGY-2 [facility] 10:14 AM 03/30/2023  [facility]

## 2023-03-30 NOTE — Assessment & Plan Note (Signed)
Remained stable on Suboxone 8-2 twice daily.  PDMP reviewed without concerns.  Prescribed 30-day supply to next appointment with me on 08/21, will repeat urine drug screen today.

## 2023-03-30 NOTE — Assessment & Plan Note (Signed)
Last A1c 6.5 Trulicity and Ozempic prior Auth denied.  Patient would like to trial lifestyle modifications including his home exercise regimen and diet before starting metformin.  Will recheck A1c next visit in 1 month.

## 2023-03-30 NOTE — Assessment & Plan Note (Signed)
Patient requesting refill, PHQ-9 score 3.

## 2023-03-30 NOTE — Assessment & Plan Note (Signed)
Elevated x 2 today.  Patient does not wish to start blood pressure medication at this time.  Discussed that this is now 2 clinic visits neuro with elevated blood pressures, and I recommend starting either calcium channel blocker or ARB.  Patient would like to trial lifestyle modifications first but amenable to starting medication if still elevated at next visit in 1 month.  Did not wish to do ambulatory blood pressure monitoring with Dr. Raymondo Band.

## 2023-04-27 NOTE — Progress Notes (Signed)
    SUBJECTIVE:   CHIEF COMPLAINT / HPI: suboxone f/u, BP  Substance use disorder - Doing okay with 2 film daily under tongue. Does not think he has tried one film in the morning and one in the afternoon. Thinks it is still helping him with all of his ADL and IADLs. No constipation. Does not take any stool softeners.  Blood pressure - Still elevated today. Does not want to start a medication today. Measuring at home: 120/130s over 90/95. Exercising daily. Attempting to eat healthier. Open to seeing nutritionist.  PERTINENT  PMH / PSH: T2DM, HTN  OBJECTIVE:   BP (!) 136/95   Pulse 76   Ht 5\' 4"  (1.626 m)   Wt 212 lb 2 oz (96.2 kg)   SpO2 98%   BMI 36.41 kg/m   General: NAD, well appearing Neuro: A&O Respiratory: normal WOB on RA Extremities: Moving all 4 extremities equally   ASSESSMENT/PLAN:   Type 2 diabetes mellitus without complication, without long-term current use of insulin (HCC) Assessment & Plan: A1c today 5.6.  Patient would like to start Ozempic for weight loss and diabetes.  Has previously been denied in the past, will attempt to send again but doubt that it will be approved by insurance.  Diabetes is well-controlled, no indication other medications.  Orders: -     POCT glycosylated hemoglobin (Hb A1C) -     Semaglutide(0.25 or 0.5MG /DOS); Inject 0.25 mg into the skin once a week. 0.25 mg once weekly for 4 weeks then increase to 0.5 mg weekly for at least 4 weeks,max 1 mg  Dispense: 3 mL; Refill: 0  Substance use disorder Assessment & Plan: Continue 16 mg Suboxone daily as this regimen is continuing to enable patient in managing pain and daily functional activities.  Discussed that he may try 1 film sublingually in the morning and 1 in the afternoon if he chooses to read both at the same time.  Refilled Suboxone, follow-up 1 month.  Orders: -     Buprenorphine HCl-Naloxone HCl; Place 2 Film under the tongue daily.  Dispense: 60 Film; Refill: 0  Morbid obesity  (HCC) Assessment & Plan: Patient doing extremely well with exercising daily and is lost 30 pounds over the past 2 years.  Will refer to nutrition for further weight loss management.  Orders: -     Semaglutide(0.25 or 0.5MG /DOS); Inject 0.25 mg into the skin once a week. 0.25 mg once weekly for 4 weeks then increase to 0.5 mg weekly for at least 4 weeks,max 1 mg  Dispense: 3 mL; Refill: 0 -     Amb ref to Medical Nutrition Therapy-MNT  Hypertension, unspecified type Assessment & Plan: Elevated x 2 today in clinic.  Previously elevated both at home and at prior visits.  Patient adamant against starting blood pressure medication.  Overall blood pressure has down trended per review within flowsheets.  Due to this I think it is reasonable to continue management via lifestyle modifications at this time.  Suspect that continued weight loss will improve patient's blood pressure.  Discussed that if blood pressure does not continue to improve in the next 1 to 2 months that he willing to start a medication which patient is agreeable.  Discussed nutrition referral and provided with phone number to call.    Return in about 1 month (around 05/29/2023).  Celine Mans, MD [facility]

## 2023-04-28 ENCOUNTER — Encounter: Payer: Self-pay | Admitting: Family Medicine

## 2023-04-28 ENCOUNTER — Telehealth: Payer: Self-pay

## 2023-04-28 ENCOUNTER — Ambulatory Visit (INDEPENDENT_AMBULATORY_CARE_PROVIDER_SITE_OTHER): Payer: Commercial Managed Care - HMO | Admitting: Family Medicine

## 2023-04-28 VITALS — BP 136/95 | HR 76 | Ht 64.0 in | Wt 212.1 lb

## 2023-04-28 DIAGNOSIS — Z7985 Long-term (current) use of injectable non-insulin antidiabetic drugs: Secondary | ICD-10-CM

## 2023-04-28 DIAGNOSIS — I10 Essential (primary) hypertension: Secondary | ICD-10-CM

## 2023-04-28 DIAGNOSIS — F1990 Other psychoactive substance use, unspecified, uncomplicated: Secondary | ICD-10-CM | POA: Diagnosis not present

## 2023-04-28 DIAGNOSIS — E119 Type 2 diabetes mellitus without complications: Secondary | ICD-10-CM

## 2023-04-28 LAB — POCT GLYCOSYLATED HEMOGLOBIN (HGB A1C): HbA1c, POC (controlled diabetic range): 5.6 % (ref 0.0–7.0)

## 2023-04-28 MED ORDER — BUPRENORPHINE HCL-NALOXONE HCL 8-2 MG SL FILM
16.0000 mg | ORAL_FILM | Freq: Every day | SUBLINGUAL | 0 refills | Status: DC
Start: 2023-04-28 — End: 2023-05-28

## 2023-04-28 MED ORDER — SEMAGLUTIDE(0.25 OR 0.5MG/DOS) 2 MG/1.5ML ~~LOC~~ SOPN
0.2500 mg | PEN_INJECTOR | SUBCUTANEOUS | 0 refills | Status: DC
Start: 2023-04-28 — End: 2023-09-21

## 2023-04-28 NOTE — Assessment & Plan Note (Signed)
A1c today 5.6.  Patient would like to start Ozempic for weight loss and diabetes.  Has previously been denied in the past, will attempt to send again but doubt that it will be approved by insurance.  Diabetes is well-controlled, no indication other medications.

## 2023-04-28 NOTE — Assessment & Plan Note (Signed)
Patient doing extremely well with exercising daily and is lost 30 pounds over the past 2 years.  Will refer to nutrition for further weight loss management.

## 2023-04-28 NOTE — Telephone Encounter (Signed)
Attempted to reach patient. To give Dr. Gerilyn Pilgrim phone number. Didn't answer. LVM for patient to call Dr. Gerilyn Pilgrim in order to get an appt.  [phone]. I also left Dr. Gerilyn Pilgrim phone number. Aquilla Solian, CMA

## 2023-04-28 NOTE — Patient Instructions (Addendum)
It was great to see you! Thank you for allowing me to participate in your care!  Our plans for today:  - As we discussed, I have provided the phone number below to discuss with our nutritionist Dr. Gerilyn Pilgrim. This is the phone number to call [phone] - If your blood pressure does not improve in the next 2 months, we will need to start a medication. - You may try taking your Suboxone once in the evening and once in the morning.   Please arrive 15 minutes PRIOR to your next scheduled appointment time! If you do not, this affects OTHER patients' care.  Take care and seek immediate care sooner if you develop any concerns.   Celine Mans, MD, PGY-2 [facility] 10:51 AM 04/28/2023  [facility]

## 2023-04-28 NOTE — Assessment & Plan Note (Signed)
Continue 16 mg Suboxone daily as this regimen is continuing to enable patient in managing pain and daily functional activities.  Discussed that he may try 1 film sublingually in the morning and 1 in the afternoon if he chooses to read both at the same time.  Refilled Suboxone, follow-up 1 month.

## 2023-04-28 NOTE — Assessment & Plan Note (Addendum)
Elevated x 2 today in clinic.  Previously elevated both at home and at prior visits.  Patient adamant against starting blood pressure medication.  Overall blood pressure has down trended per review within flowsheets.  Due to this I think it is reasonable to continue management via lifestyle modifications at this time.  Suspect that continued weight loss will improve patient's blood pressure.  Discussed that if blood pressure does not continue to improve in the next 1 to 2 months that he willing to start a medication which patient is agreeable.  Discussed nutrition referral and provided with phone number to call.

## 2023-04-30 ENCOUNTER — Other Ambulatory Visit (HOSPITAL_COMMUNITY): Payer: Self-pay

## 2023-05-23 IMAGING — CR DG CHEST 1V
1 series · 1 of 1 positions shown · non-contrast
Comparison: None.

CLINICAL DATA: Positive PPD.

EXAM:
CHEST  1 VIEW

[w chest pa]
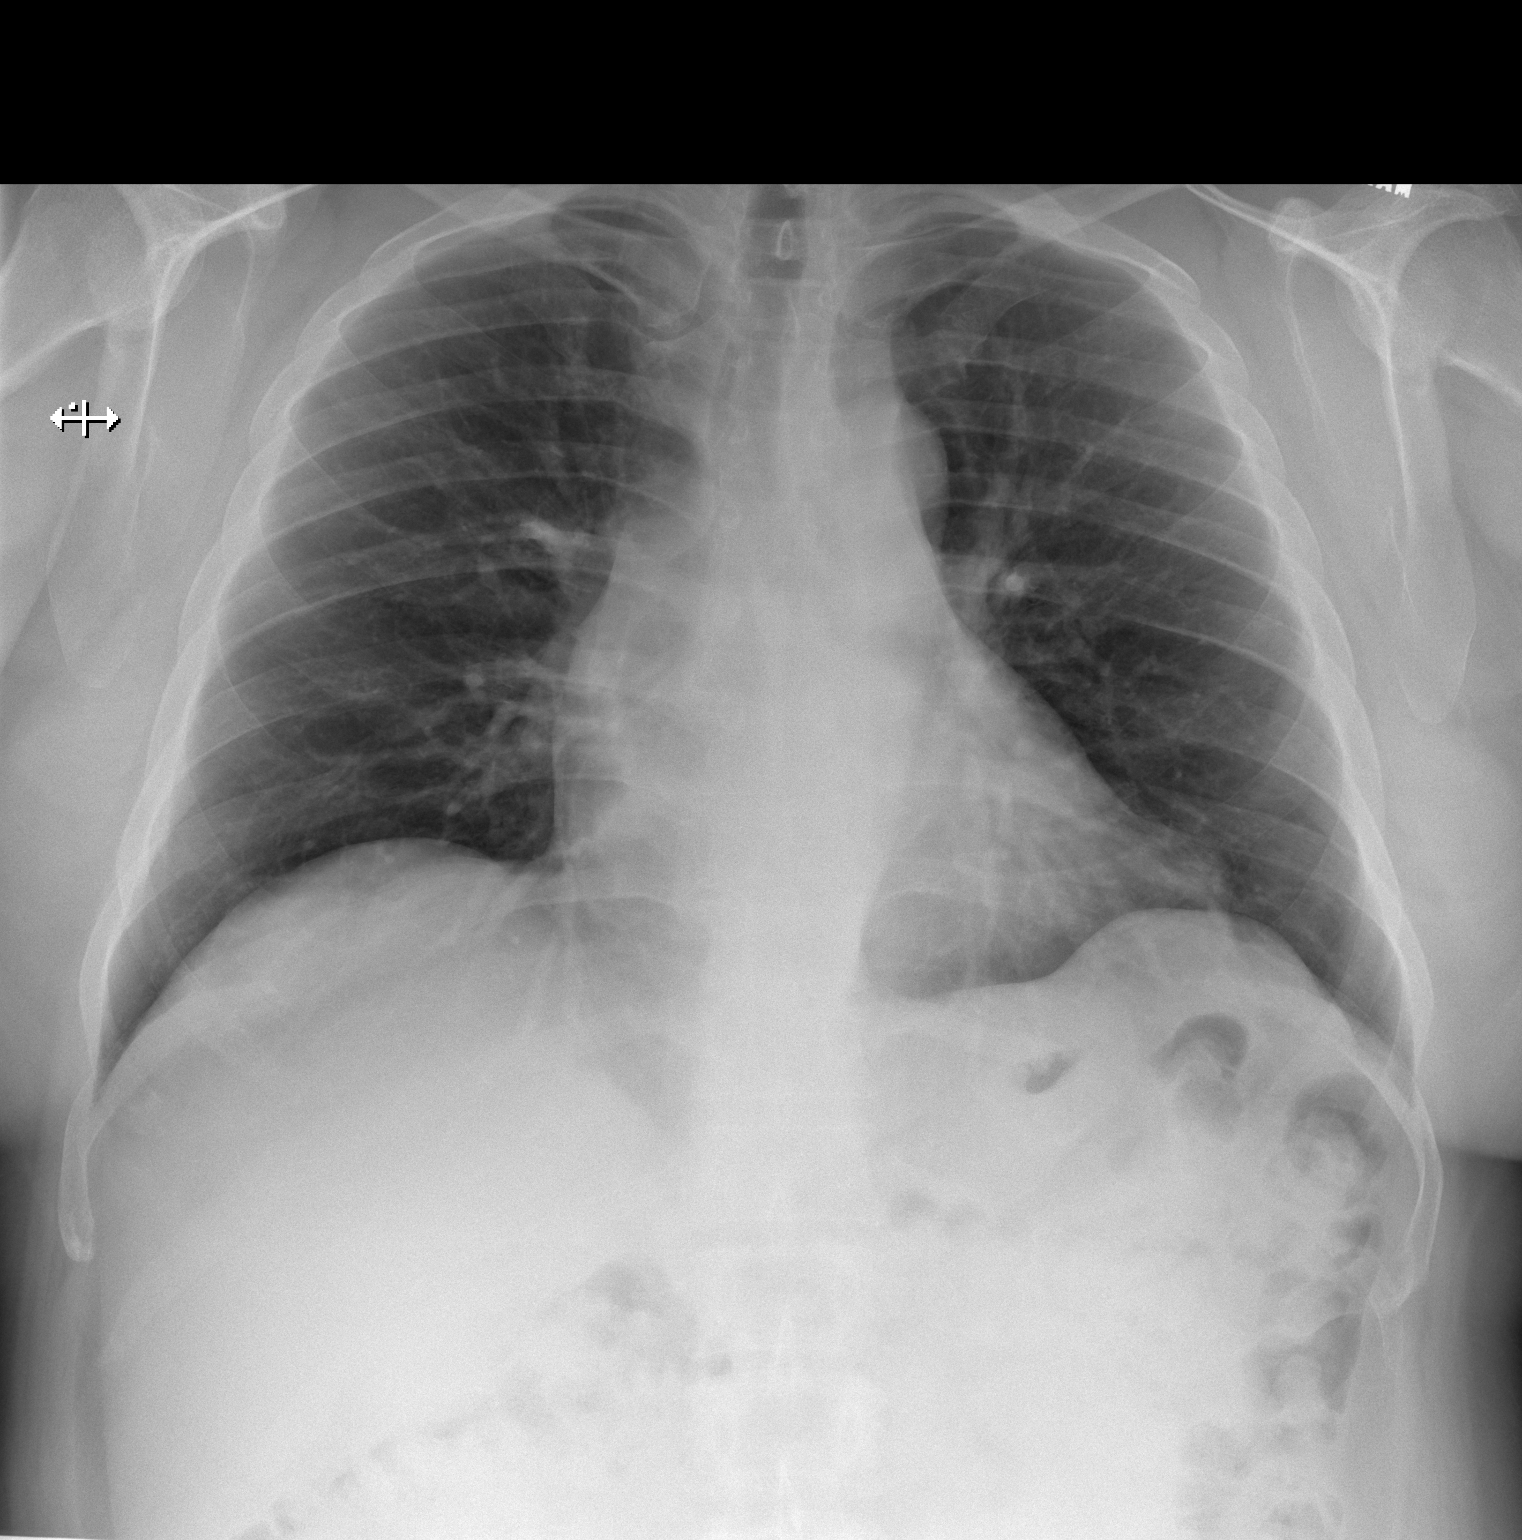

[1 of 1 positions shown; findings below may reference images not displayed]

FINDINGS: The heart size and mediastinal contours are within normal limits.
Both lungs are clear. The visualized skeletal structures are
unremarkable.
IMPRESSION: No active disease.

## 2023-05-28 ENCOUNTER — Other Ambulatory Visit: Payer: Self-pay

## 2023-05-28 DIAGNOSIS — F1990 Other psychoactive substance use, unspecified, uncomplicated: Secondary | ICD-10-CM

## 2023-05-31 MED ORDER — BUPRENORPHINE HCL-NALOXONE HCL 8-2 MG SL FILM: 16.0000 mg | ORAL_FILM | Freq: Every day | SUBLINGUAL | 0 refills | Status: DC

## 2023-06-10 ENCOUNTER — Encounter: Payer: Self-pay | Admitting: Family Medicine

## 2023-06-10 ENCOUNTER — Ambulatory Visit (INDEPENDENT_AMBULATORY_CARE_PROVIDER_SITE_OTHER): Payer: Managed Care, Other (non HMO) | Admitting: Family Medicine

## 2023-06-10 VITALS — BP 138/87 | HR 72 | Ht 64.0 in | Wt 217.1 lb

## 2023-06-10 DIAGNOSIS — Z23 Encounter for immunization: Secondary | ICD-10-CM | POA: Diagnosis not present

## 2023-06-10 DIAGNOSIS — F1990 Other psychoactive substance use, unspecified, uncomplicated: Secondary | ICD-10-CM | POA: Diagnosis not present

## 2023-06-10 MED ORDER — BUPRENORPHINE HCL-NALOXONE HCL 8-2 MG SL FILM: 16.0000 mg | ORAL_FILM | Freq: Every day | SUBLINGUAL | 0 refills | Status: DC

## 2023-06-10 NOTE — Patient Instructions (Signed)
It was great to see you! Thank you for allowing me to participate in your care!  Our plans for today:  - I am glad you are doing well. - I will let you know the results of your urine test. - I will see you again in 1 month.   Please arrive 15 minutes PRIOR to your next scheduled appointment time! If you do not, this affects OTHER patients' care.  Take care and seek immediate care sooner if you develop any concerns.   Celine Mans, MD, PGY-2 [facility] 9:52 AM 06/10/2023  [facility]

## 2023-06-10 NOTE — Assessment & Plan Note (Signed)
Well-controlled, will follow-up tox assure 13.  Continue Suboxone 8-2 films twice daily.  Follow-up 1 month.  Suboxone refilled.

## 2023-06-10 NOTE — Progress Notes (Signed)
    SUBJECTIVE:   CHIEF COMPLAINT / HPI: SUD follow-up  SUD - Switched to 1 film BID at last visit. Doing well with this new regimen. Has been taking daily. No constipation. Feels like it is helping him live a normal life. 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 Had some stress recently related to social worker taking foster kids from him.   PERTINENT  PMH / PSH: SUD  OBJECTIVE:   BP 138/87   Pulse 72   Ht 5\' 4"  (1.626 m)   Wt 217 lb 2 oz (98.5 kg)   SpO2 96%   BMI 37.27 kg/m   General: NAD, well appearing Neuro: A&O Respiratory: normal WOB on RA Extremities: Moving all 4 extremities equally    ASSESSMENT/PLAN:   Assessment & Plan Substance use disorder Well-controlled, will follow-up tox assure 13.  Continue Suboxone 8-2 films twice daily.  Follow-up 1 month.  Suboxone refilled. Return in about 4 weeks (around 07/08/2023).  Celine Mans, MD [facility]

## 2023-06-16 LAB — TOXASSURE SELECT 13 (MW), URINE

## 2023-07-14 ENCOUNTER — Encounter: Payer: Self-pay | Admitting: Student

## 2023-07-14 ENCOUNTER — Ambulatory Visit (INDEPENDENT_AMBULATORY_CARE_PROVIDER_SITE_OTHER): Payer: No Typology Code available for payment source | Admitting: Student

## 2023-07-14 VITALS — BP 138/86 | HR 70 | Ht 64.0 in | Wt 220.0 lb

## 2023-07-14 DIAGNOSIS — F32A Depression, unspecified: Secondary | ICD-10-CM

## 2023-07-14 DIAGNOSIS — F1990 Other psychoactive substance use, unspecified, uncomplicated: Secondary | ICD-10-CM

## 2023-07-14 MED ORDER — FLUOXETINE HCL 40 MG PO CAPS
40.0000 mg | ORAL_CAPSULE | Freq: Every day | ORAL | 1 refills | Status: DC
Start: 2023-07-14 — End: 2024-01-19

## 2023-07-14 MED ORDER — BUPRENORPHINE HCL-NALOXONE HCL 8-2 MG SL FILM: 16.0000 mg | ORAL_FILM | Freq: Every day | SUBLINGUAL | 0 refills | Status: DC

## 2023-07-14 NOTE — Assessment & Plan Note (Signed)
Reports no symptoms today.  Needs refill of fluoxetine. - Fluoxetine 40 mg daily

## 2023-07-14 NOTE — Patient Instructions (Signed)
It was great to see you! Thank you for allowing me to participate in your care!   I recommend that you always bring your medications to each appointment as this makes it easy to ensure we are on the correct medications and helps [place] not miss when refills are needed.  Our plans for today:  - Please follow-up with Dr. Velna Ochs in 1 month  We are checking some labs today, I will call you if they are abnormal will send you a MyChart message or a letter if they are normal.  If you do not hear about your labs in the next 2 weeks please let us know.  Take care and seek immediate care sooner if you develop any concerns. Please remember to show up 15 minutes before your scheduled appointment time!  Tiffany Kocher, DO [facility]

## 2023-07-14 NOTE — Assessment & Plan Note (Signed)
Patient reports compliance with Suboxone, and is overall doing well. - Suboxone refill sent - Obtain tox assure 13

## 2023-07-14 NOTE — Progress Notes (Signed)
    SUBJECTIVE:   CHIEF COMPLAINT / HPI:   Substance Use Disorder  Depression Patient reports compliance with Suboxone.  Denies other substance use at this time.  Reports he has a job that he started next week.  He primarily works in [employer] history.  With regard to his depression, he feels his symptoms are much improved.  He has no acute concerns today.  He is agreeable to tox assure screening today.  OBJECTIVE:   BP 138/86   Pulse 70   Ht 5\' 4"  (1.626 m)   Wt 220 lb (99.8 kg)   SpO2 97%   BMI 37.76 kg/m    General: NAD, pleasant Cardio: RRR, no MRG. Respiratory: CTAB, normal wob on RA GI: Abdomen is soft, not tender, not distended. BS present Skin: Warm and dry  ASSESSMENT/PLAN:   Assessment & Plan Substance use disorder Patient reports compliance with Suboxone, and is overall doing well. - Suboxone refill sent - Obtain tox assure 13 Depression, unspecified depression type Reports no symptoms today.  Needs refill of fluoxetine. - Fluoxetine 40 mg daily  Follow recommendations Recommend follow-up with PCP in the next 2 to 4 weeks to discuss diabetes management and review care gaps  Tiffany Kocher, DO [facility]

## 2023-07-17 LAB — TOXASSURE SELECT 13 (MW), URINE

## 2023-08-12 ENCOUNTER — Encounter: Payer: Self-pay | Admitting: Family Medicine

## 2023-08-12 ENCOUNTER — Ambulatory Visit (INDEPENDENT_AMBULATORY_CARE_PROVIDER_SITE_OTHER): Payer: Commercial Managed Care - HMO | Admitting: Family Medicine

## 2023-08-12 VITALS — BP 139/98 | HR 82 | Ht 64.0 in | Wt 219.6 lb

## 2023-08-12 DIAGNOSIS — F1990 Other psychoactive substance use, unspecified, uncomplicated: Secondary | ICD-10-CM

## 2023-08-12 DIAGNOSIS — I10 Essential (primary) hypertension: Secondary | ICD-10-CM

## 2023-08-12 MED ORDER — BUPRENORPHINE HCL-NALOXONE HCL 8-2 MG SL FILM: 16.0000 mg | ORAL_FILM | Freq: Every day | SUBLINGUAL | 0 refills | Status: DC

## 2023-08-12 NOTE — Progress Notes (Signed)
    SUBJECTIVE:   CHIEF COMPLAINT / HPI:   Substance abuse - Significant family stress. Brother passed away 3 weeks ago. Has nephew who has cancer. Multiple other family members through his wife have had recent injuries and health issues. Feels Suboxone is helping. Reports there was an error with the pharmacy where they only gave him 10 less, but this was corrected. Still does not want to take opiates.   Lofexidine, states he accidentally took his wife's pill yesterday because of the way they organize their pills in pill boxes. Patient denies benzo use.  PERTINENT  PMH / PSH: Prediabetes, Substance abuse  OBJECTIVE:   BP (!) 145/91   Pulse 82   Ht 5\' 4"  (1.626 m)   Wt 219 lb 9.6 oz (99.6 kg)   SpO2 98%   BMI 37.69 kg/m   General: NAD, well appearing Neuro: A&O Respiratory: normal WOB on RA Extremities: Moving all 4 extremities equally   ASSESSMENT/PLAN:   Assessment & Plan Substance use disorder Urine tox remains positive for alprazolam and metabolites.  Patient continues to deny benzodiazepine. Unsure what would persistently cause benzo metabolites within tox assure.  Possibly continued Prozac use.  Will repeat Toxassure 13 today. Refilled Suboxone.  Reassuringly, Suboxone concentrations remain consistent with continued use.  Return in about 1 month (around 09/12/2023).  Celine Mans, MD [facility]

## 2023-08-12 NOTE — Patient Instructions (Addendum)
It was great to see you! Thank you for allowing me to participate in your care!  Our plans for today:  - I have refilled your suboxone. I will see you again in 1 month. - Please call your insurance about Ozempic.   Please arrive 15 minutes PRIOR to your next scheduled appointment time! If you do not, this affects OTHER patients' care.  Take care and seek immediate care sooner if you develop any concerns.   Celine Mans, MD, PGY-2 [facility] 10:29 AM 08/12/2023  [facility]

## 2023-08-12 NOTE — Assessment & Plan Note (Addendum)
Urine tox remains positive for alprazolam and metabolites.  Patient continues to deny benzodiazepine. Unsure what would persistently cause benzo metabolites within tox assure.  Possibly continued Prozac use.  Will repeat Toxassure 13 today. Refilled Suboxone.  Reassuringly, Suboxone concentrations remain consistent with continued use.

## 2023-08-17 LAB — TOXASSURE SELECT 13 (MW), URINE

## 2023-09-21 ENCOUNTER — Encounter: Payer: Self-pay | Admitting: Family Medicine

## 2023-09-21 ENCOUNTER — Ambulatory Visit (INDEPENDENT_AMBULATORY_CARE_PROVIDER_SITE_OTHER): Payer: No Typology Code available for payment source | Admitting: Family Medicine

## 2023-09-21 VITALS — BP 150/90 | HR 73 | Ht 64.0 in | Wt 223.0 lb

## 2023-09-21 DIAGNOSIS — F1990 Other psychoactive substance use, unspecified, uncomplicated: Secondary | ICD-10-CM

## 2023-09-21 DIAGNOSIS — R030 Elevated blood-pressure reading, without diagnosis of hypertension: Secondary | ICD-10-CM

## 2023-09-21 DIAGNOSIS — E119 Type 2 diabetes mellitus without complications: Secondary | ICD-10-CM

## 2023-09-21 DIAGNOSIS — Z7985 Long-term (current) use of injectable non-insulin antidiabetic drugs: Secondary | ICD-10-CM | POA: Diagnosis not present

## 2023-09-21 DIAGNOSIS — E669 Obesity, unspecified: Secondary | ICD-10-CM | POA: Diagnosis not present

## 2023-09-21 MED ORDER — WEGOVY 0.25 MG/0.5ML ~~LOC~~ SOAJ: 0.2500 mg | SUBCUTANEOUS | 0 refills | Status: DC

## 2023-09-21 MED ORDER — BUPRENORPHINE HCL-NALOXONE HCL 8-2 MG SL FILM: 16.0000 mg | ORAL_FILM | Freq: Every day | SUBLINGUAL | 0 refills | Status: DC

## 2023-09-21 NOTE — Progress Notes (Signed)
    SUBJECTIVE:   CHIEF COMPLAINT / HPI: SUD f/u  SUD follow-up -Had a good holiday -Had cold last week which has now resolved -Has been supporting wife at home -Son lives at home at age 47, has been rebellious -Has had some cravings for oxycodone due this stress -Has been sober since 2021 -Feels suboxone  is doing well, keeping from using -Continuing to exercise -Feels coming to [facility] has really helped him, feel free -Applying for citizenship this year. -Taking Prozac  -Denies taking Xanax or other benzodiazepines -Does note that he takes intermittently muscle relaxant which has not on his med list and he cannot remember the name of.   T2DM -Was unable to get Ozempic . -Open to trying other medication.  -Continues to exercise  Elevated BP -At home 130/86, 125/76 -Was higher while he was sick  PERTINENT  PMH / PSH: SUD, HTN, T2DM  OBJECTIVE:   BP (!) 151/101   Pulse 73   Ht 5' 4 (1.626 m)   Wt 223 lb (101.2 kg)   SpO2 99%   BMI 38.28 kg/m   General: NAD, well appearing Neuro: A&O Respiratory: normal WOB on RA Extremities: Moving all 4 extremities equally   ASSESSMENT/PLAN:   Assessment & Plan Substance use disorder Continues to do well on Suboxone  8-2mg  two tabs under the tongue daily. Refilled suboxone . F/u Toxassure.  As noted previously has had intermittent tox assure positive for benzo metabolites.  Still unclear cause.  Is not appropriate reason to withhold Suboxone .  Instructed to bring in all medications he takes at next visit in 1 month Type 2 diabetes mellitus without complication, without long-term current use of insulin (HCC) Last A1c 6.7.  Continue lifestyle modifications.  Will try Wegovy  under type 2 diabetes and obesity for insurance approval.  Patient agreeable to plan.  Urine microalbumin and BMP today. Obesity (BMI 35.0-39.9 without comorbidity) Scribed Wegovy  as above. Elevated blood pressure reading Blood pressure is normal at home however is  elevated x 2 today.  Per my last visit we will continue lifestyle modifications for 1-2 more months.  Suspect that he will require initiation of antihypertensive.  Discussed ambulatory blood pressure monitoring at later date but patient declined today.  Return in about 1 month (around 10/22/2023).  Eddie Provencal, MD [facility]

## 2023-09-21 NOTE — Assessment & Plan Note (Addendum)
 Continues to do well on Suboxone  8-2mg  two tabs under the tongue daily. Refilled suboxone . F/u Toxassure.  As noted previously has had intermittent tox assure positive for benzo metabolites.  Still unclear cause.  Is not appropriate reason to withhold Suboxone .  Instructed to bring in all medications he takes at next visit in 1 month

## 2023-09-21 NOTE — Assessment & Plan Note (Signed)
 Last A1c 6.7.  Continue lifestyle modifications.  Will try Wegovy under type 2 diabetes and obesity for insurance approval.  Patient agreeable to plan.  Urine microalbumin and BMP today.

## 2023-09-21 NOTE — Patient Instructions (Addendum)
 It was great to see you! Thank you for allowing me to participate in your care!  Our plans for today:  - I will let you know the results of you labs. - I have refilled your Suboxone .  - I have sent Wegovy  to your pharmacy. This is a once weekly injection for weight loss and diabetes. We will discuss at next visit in 1 month.  Please arrive 15 minutes PRIOR to your next scheduled appointment time! If you do not, this affects OTHER patients' care.  Take care and seek immediate care sooner if you develop any concerns.   Ozell Provencal, MD, PGY-2 [facility] 9:34 AM 09/21/2023  [facility]

## 2023-09-22 LAB — BASIC METABOLIC PANEL
BUN/Creatinine Ratio: 15 (ref 9–20)
BUN: 11 mg/dL (ref 6–24)
CO2: 24 mmol/L (ref 20–29)
Calcium: 9.5 mg/dL (ref 8.7–10.2)
Chloride: 103 mmol/L (ref 96–106)
Creatinine, Ser: 0.71 mg/dL — ABNORMAL LOW (ref 0.76–1.27)
Glucose: 155 mg/dL — ABNORMAL HIGH (ref 70–99)
Potassium: 3.9 mmol/L (ref 3.5–5.2)
Sodium: 141 mmol/L (ref 134–144)
eGFR: 114 mL/min/{1.73_m2} (ref >59)

## 2023-09-23 ENCOUNTER — Telehealth: Payer: Self-pay

## 2023-09-23 LAB — MICROALBUMIN / CREATININE URINE RATIO
Creatinine, Urine: 145.7 mg/dL
Microalb/Creat Ratio: 7 mg/g{creat} (ref 0–29)
Microalbumin, Urine: 10.8 ug/mL

## 2023-09-23 NOTE — Telephone Encounter (Signed)
Patient calls nurse line in regards to Suboxone prescription.   He reports he tried to pick the prescription up today as noted in comments "pick up on 16th."  I called the pharmacy and insurance will not cover prescription until 01/22.   She reports a 40 day supply was given last night. Quantity #[id number].  Spoke with patient and he is agreeable to 1/22 pick up date.

## 2023-09-24 LAB — TOXASSURE SELECT 13 (MW), URINE

## 2023-10-21 ENCOUNTER — Encounter: Payer: Self-pay | Admitting: Family Medicine

## 2023-10-21 ENCOUNTER — Ambulatory Visit (INDEPENDENT_AMBULATORY_CARE_PROVIDER_SITE_OTHER): Payer: Commercial Managed Care - HMO | Admitting: Family Medicine

## 2023-10-21 VITALS — BP 142/109 | HR 62 | Ht 64.0 in | Wt 222.0 lb

## 2023-10-21 DIAGNOSIS — E119 Type 2 diabetes mellitus without complications: Secondary | ICD-10-CM

## 2023-10-21 DIAGNOSIS — F1990 Other psychoactive substance use, unspecified, uncomplicated: Secondary | ICD-10-CM

## 2023-10-21 DIAGNOSIS — I10 Essential (primary) hypertension: Secondary | ICD-10-CM

## 2023-10-21 LAB — POCT GLYCOSYLATED HEMOGLOBIN (HGB A1C): HbA1c, POC (controlled diabetic range): 6 % (ref 0.0–7.0)

## 2023-10-21 MED ORDER — BUPRENORPHINE HCL-NALOXONE HCL 8-2 MG SL FILM
24.0000 mg | ORAL_FILM | Freq: Every day | SUBLINGUAL | 0 refills | Status: DC
Start: 2023-10-21 — End: 2023-11-22

## 2023-10-21 NOTE — Assessment & Plan Note (Signed)
Uncontrolled, patient adamant against starting medication. Multiple elevations in clinic at separate appointments with failed lifestyle modification intervention despite some success at weight loss. Patient requesting ambulatory monitoring. Scheduled with Dr. Raymondo Band.

## 2023-10-21 NOTE — Progress Notes (Signed)
    SUBJECTIVE:   CHIEF COMPLAINT / HPI: SUD f/u  Stressors at home are better. Taking Fluoxetine. Does not take Benadryl. Take Eccedrin. Taking Suboxone, no concerns. Taking 8-2 x2 in the morning. Took an extra dose to help with anxiety. Denies cravings or thoughts of using. Expresses desire to not ever go through withdrawal again. Endorses that he has taken opioids couple times. Thinks increasing the dose to 3 per day would help. No constipation. Not interested in mental health counseling or therapy.  Does not want to start BP medicine. Wants to do ambulatory blood pressure. Feels BP at home is good.  PERTINENT  PMH / PSH: HTN, Depression, T2DM, SUD  OBJECTIVE:   BP (!) 142/109   Pulse 62   Ht 5\' 4"  (1.626 m)   Wt 222 lb (100.7 kg)   SpO2 98%   BMI 38.11 kg/m   General: NAD, well appearing Neuro: A&O Respiratory: normal WOB on RA Extremities: Moving all 4 extremities equally   ASSESSMENT/PLAN:   Assessment & Plan Substance use disorder Does not have cravings; however, does intermittent thoughts of using with rare relapse. Very sincere and invested in avoiding returning when he was using opioids. Suspect that underlying anxiety is contributing; however, will increase Suboxone to 24mg  daily (3x 8-2 tabs). Unclear cause of recent Toxassure positive for fentanyl, possibly drug interaction vs relapse. Patient denies relapse. Reassuringly, patient urine is consistent with taking Suboxone regularly. Repeat Toxassure today. Follow-up 1 month. Hypertension, unspecified type Uncontrolled, patient adamant against starting medication. Multiple elevations in clinic at separate appointments with failed lifestyle modification intervention despite some success at weight loss. Patient requesting ambulatory monitoring. Scheduled with Dr. Raymondo Band. Type 2 diabetes mellitus without complication, without long-term current use of insulin (HCC) Repeat A1c today. Discuss at next visit 1  month.  Return in about 1 month (around 11/18/2023).  Celine Mans, MD [facility]

## 2023-10-21 NOTE — Assessment & Plan Note (Signed)
Does not have cravings; however, does intermittent thoughts of using with rare relapse. Very sincere and invested in avoiding returning when he was using opioids. Suspect that underlying anxiety is contributing; however, will increase Suboxone to 24mg  daily (3x 8-2 tabs). Unclear cause of recent Toxassure positive for fentanyl, possibly drug interaction vs relapse. Patient denies relapse. Reassuringly, patient urine is consistent with taking Suboxone regularly. Repeat Toxassure today. Follow-up 1 month.

## 2023-10-21 NOTE — Assessment & Plan Note (Signed)
Repeat A1c today. Discuss at next visit 1 month.

## 2023-10-21 NOTE — Patient Instructions (Addendum)
It was great to see you! Thank you for allowing me to participate in your care!  Our plans for today:  - I have increased your suboxone to 3 tabs daily and sent this to your pharmacy. - Please schedule with Dr. Raymondo Band for ambulatory blood pressure monitoring. - I will see you again in 1 month.   Please arrive 15 minutes PRIOR to your next scheduled appointment time! If you do not, this affects OTHER patients' care.  Take care and seek immediate care sooner if you develop any concerns.   Celine Mans, MD, PGY-2 [facility] 10:10 AM 10/21/2023  [facility]

## 2023-10-25 LAB — TOXASSURE SELECT 13 (MW), URINE

## 2023-11-08 ENCOUNTER — Ambulatory Visit: Payer: No Typology Code available for payment source | Admitting: Pharmacist

## 2023-11-22 ENCOUNTER — Ambulatory Visit (INDEPENDENT_AMBULATORY_CARE_PROVIDER_SITE_OTHER): Payer: No Typology Code available for payment source | Admitting: Family Medicine

## 2023-11-22 ENCOUNTER — Encounter: Payer: Self-pay | Admitting: Family Medicine

## 2023-11-22 VITALS — BP 152/82 | HR 67 | Ht 64.0 in | Wt 226.4 lb

## 2023-11-22 DIAGNOSIS — M25562 Pain in left knee: Secondary | ICD-10-CM | POA: Diagnosis not present

## 2023-11-22 DIAGNOSIS — G8929 Other chronic pain: Secondary | ICD-10-CM

## 2023-11-22 DIAGNOSIS — I10 Essential (primary) hypertension: Secondary | ICD-10-CM | POA: Diagnosis not present

## 2023-11-22 DIAGNOSIS — F1990 Other psychoactive substance use, unspecified, uncomplicated: Secondary | ICD-10-CM | POA: Diagnosis not present

## 2023-11-22 MED ORDER — BUPRENORPHINE HCL-NALOXONE HCL 8-2 MG SL FILM: 24.0000 mg | ORAL_FILM | Freq: Every day | SUBLINGUAL | 0 refills | Status: DC

## 2023-11-22 NOTE — Assessment & Plan Note (Signed)
 Suspect medial meniscal tear versus medial comparments OA.  -Conservative treatment -Provided with Rehab exercises -Instructed for 6-8 weeks 3-4 days per week x2 twice per day -Avoid aggravating activity -4 view knee x ray, including sunrise and weight bearing -Tylenol, Voltaren and Ibuprofen as needed for pain -If not improving 6-8 weeks of PT, consider left knee MRI

## 2023-11-22 NOTE — Assessment & Plan Note (Signed)
 Uncontrolled, per my previous discussion patient does not want to start medication. Patient will reschedule with Dr. Raymondo Band for ambulatory BP monitoring.

## 2023-11-22 NOTE — Progress Notes (Signed)
    SUBJECTIVE:   CHIEF COMPLAINT / HPI: SUD f/u, BP  Knee pain Ongoing long time on medial side of left knee. Used to play soccer. Sometimes hurts with activity or poor position of knee. Does not get swollen. Jogs slowly. Walks dogs. No locking or clicking. No falls or trauma  SUD Taking two suboxone in the morning and one at night. Has helped with cravings and some anxiety. No constipation. Discussed positive Toxassure from lost. States he is taking sometimes a prescribed Tylenol.  HTN Says blood pressure at home 125/80s. Taking intermittently.  Knows he missed ambulatory bp monitoring with Dr. Raymondo Band   PERTINENT  PMH / PSH: HTN, Depression, T2DM, SUD   OBJECTIVE:   BP (!) 152/82   Pulse 67   Ht 5\' 4"  (1.626 m)   Wt 226 lb 6 oz (102.7 kg)   SpO2 93%   BMI 38.86 kg/m   General: NAD, well appearing Neuro: A&O Respiratory: normal WOB on RA Extremities: Moving all 4 extremities equally Left knee: No obvious effusion, swelling, erythema, medial joint line TTP, full ROM flexion and extension, no fluid wave, negative lachman, negative valgus and varus stress tests, positive McMurrays  Limited Left Knee [place]: Physiologic knee effusion. No obvious irregular bony cortex on medial aspect of knee. No obvious superficial medial meniscus tear. Mild hypoechoic changes that may be consistent with edema due to meniscal injury overlying the posterior aspect of the medial meniscus. Technologically limited by non-linear probe, ButterFly.   ASSESSMENT/PLAN:   Assessment & Plan Substance use disorder Stable, continue Suboxone 3 tabs daily. Follow-up 1 month. Instructed to stop taking "prescribed" Tylenol. Hypertension, unspecified type Uncontrolled, per my previous discussion patient does not want to start medication. Patient will reschedule with Dr. Raymondo Band for ambulatory BP monitoring. Chronic pain of left knee Suspect medial meniscal tear versus medial comparments OA.   -Conservative treatment -Provided with Rehab exercises -Instructed for 6-8 weeks 3-4 days per week x2 twice per day -Avoid aggravating activity -4 view knee x ray, including sunrise and weight bearing -Tylenol, Voltaren and Ibuprofen as needed for pain -If not improving 6-8 weeks of PT, consider left knee MRI  Return in about 1 month (around 12/23/2023) for SUD f/u.  Celine Mans, MD [facility]

## 2023-11-22 NOTE — Assessment & Plan Note (Signed)
 Stable, continue Suboxone 3 tabs daily. Follow-up 1 month. Instructed to stop taking "prescribed" Tylenol.

## 2023-11-22 NOTE — Patient Instructions (Addendum)
 It was great to see you! Thank you for allowing me to participate in your care!  Our plans for today:  - Please do your meniscal exercise 3-4 times per week. - You may take Tylenol and Ibuprofen as needed for pain. - Do not do exercises that hurt. - You may go to the hospital or [facility] to have your knee X rays done. - Please schedule you appointment with Dr. Raymondo Band for ambulatory blood pressuring monitoring.   Please arrive 15 minutes PRIOR to your next scheduled appointment time! If you do not, this affects OTHER patients' care.  Take care and seek immediate care sooner if you develop any concerns.   Celine Mans, MD, PGY-2 [facility] 2:52 PM 11/22/2023  [facility]

## 2023-11-24 LAB — TOXASSURE SELECT 13 (MW), URINE

## 2023-12-21 ENCOUNTER — Encounter: Payer: Self-pay | Admitting: Family Medicine

## 2023-12-21 ENCOUNTER — Ambulatory Visit (INDEPENDENT_AMBULATORY_CARE_PROVIDER_SITE_OTHER): Payer: Self-pay | Admitting: Family Medicine

## 2023-12-21 VITALS — BP 141/94 | HR 79 | Ht 64.0 in | Wt 226.6 lb

## 2023-12-21 DIAGNOSIS — I10 Essential (primary) hypertension: Secondary | ICD-10-CM

## 2023-12-21 DIAGNOSIS — J309 Allergic rhinitis, unspecified: Secondary | ICD-10-CM | POA: Diagnosis not present

## 2023-12-21 DIAGNOSIS — F1990 Other psychoactive substance use, unspecified, uncomplicated: Secondary | ICD-10-CM | POA: Diagnosis not present

## 2023-12-21 MED ORDER — CETIRIZINE HCL 10 MG PO TABS: 10.0000 mg | ORAL_TABLET | Freq: Every day | ORAL | 3 refills | Status: DC

## 2023-12-21 MED ORDER — BUPRENORPHINE HCL-NALOXONE HCL 8-2 MG SL FILM: 24.0000 mg | ORAL_FILM | Freq: Every day | SUBLINGUAL | 0 refills | Status: DC

## 2023-12-21 NOTE — Assessment & Plan Note (Signed)
 Controlled, doing well. -Refilled Suboxone today -Toxassure today

## 2023-12-21 NOTE — Assessment & Plan Note (Signed)
 Uncontrolled, unwilling to start BP medicines. -Ambulatory monitoring with Dr. Koval

## 2023-12-21 NOTE — Assessment & Plan Note (Signed)
-  Refilled Zyrtec per patient request with increased pollen.

## 2023-12-21 NOTE — Progress Notes (Signed)
    SUBJECTIVE:   CHIEF COMPLAINT / HPI:   HTN -No showed Dr. Koval 03/03. -He will reschedule -Resistant to starting BP meds. -He feels it high only at office.  SUD -Normal Toxassure 03/17 -Doing well -No cravings or constipation -Still using 3 tabs  PERTINENT  PMH / PSH: Opioid abuse, HTN, T2DM  OBJECTIVE:   BP (!) 141/94   Pulse 79   Ht 5\' 4"  (1.626 m)   Wt 226 lb 9.6 oz (102.8 kg)   SpO2 96%   BMI 38.90 kg/m   General: NAD, well appearing Neuro: A&O Respiratory: normal WOB on RA Extremities: Moving all 4 extremities equally   ASSESSMENT/PLAN:   Assessment & Plan Substance use disorder Controlled, doing well. -Refilled Suboxone today -Toxassure today Hypertension, unspecified type Uncontrolled, unwilling to start BP medicines. -Ambulatory monitoring with Dr. Koval Allergic rhinitis, unspecified seasonality, unspecified trigger -Refilled Zyrtec per patient request with increased pollen.  Return in about 1 month (around 01/20/2024).  Ivin Marrow, MD [facility]

## 2023-12-21 NOTE — Patient Instructions (Addendum)
 It was great to see you! Thank you for allowing me to participate in your care!  Our plans for today:  - Please make sure to go to your appointment with Dr. Koval to measure your blood pressure. - I recommend we start a blood pressure medicine. - Let me know if you have any concerns with your knee.   Please arrive 15 minutes PRIOR to your next scheduled appointment time! If you do not, this affects OTHER patients' care.  Take care and seek immediate care sooner if you develop any concerns.   Eddie Marrow, MD, PGY-2 [facility] Family Medicine 1:27 PM 12/21/2023  [facility] Family Medicine

## 2023-12-24 LAB — TOXASSURE SELECT 13 (MW), URINE

## 2023-12-27 ENCOUNTER — Other Ambulatory Visit: Payer: Self-pay | Admitting: Student

## 2023-12-27 DIAGNOSIS — J309 Allergic rhinitis, unspecified: Secondary | ICD-10-CM

## 2023-12-27 DIAGNOSIS — F1990 Other psychoactive substance use, unspecified, uncomplicated: Secondary | ICD-10-CM

## 2024-01-05 ENCOUNTER — Ambulatory Visit: Admitting: Pharmacist

## 2024-01-19 ENCOUNTER — Ambulatory Visit (INDEPENDENT_AMBULATORY_CARE_PROVIDER_SITE_OTHER): Payer: Self-pay | Admitting: Student

## 2024-01-19 VITALS — BP 129/95 | HR 67 | Ht 60.0 in | Wt 228.5 lb

## 2024-01-19 DIAGNOSIS — F1990 Other psychoactive substance use, unspecified, uncomplicated: Secondary | ICD-10-CM

## 2024-01-19 DIAGNOSIS — J309 Allergic rhinitis, unspecified: Secondary | ICD-10-CM

## 2024-01-19 DIAGNOSIS — F32A Depression, unspecified: Secondary | ICD-10-CM

## 2024-01-19 DIAGNOSIS — E119 Type 2 diabetes mellitus without complications: Secondary | ICD-10-CM

## 2024-01-19 LAB — POCT GLYCOSYLATED HEMOGLOBIN (HGB A1C): HbA1c, POC (controlled diabetic range): 6.3 % (ref 0.0–7.0)

## 2024-01-19 MED ORDER — FLUOXETINE HCL 40 MG PO CAPS: 40.0000 mg | ORAL_CAPSULE | Freq: Every day | ORAL | 1 refills | Status: DC

## 2024-01-19 MED ORDER — CETIRIZINE HCL 10 MG PO TABS: 10.0000 mg | ORAL_TABLET | Freq: Every day | ORAL | 3 refills | Status: AC

## 2024-01-19 MED ORDER — BUPRENORPHINE HCL-NALOXONE HCL 8-2 MG SL FILM
24.0000 mg | ORAL_FILM | Freq: Every day | SUBLINGUAL | 1 refills | Status: DC
Start: 2024-01-19 — End: 2024-04-10

## 2024-01-19 MED ORDER — FLUTICASONE PROPIONATE 50 MCG/ACT NA SUSP: 2.0000 | Freq: Every day | NASAL | 11 refills | Status: AC

## 2024-01-19 NOTE — Assessment & Plan Note (Signed)
 Stable, continue current rx - refilled

## 2024-01-19 NOTE — Patient Instructions (Addendum)
 It was great to see you today! Thank you for choosing [facility] for your primary care.  Today we addressed: See eye doctor for diabetic retinopathy  We will recheck BP Keep a log of your pressures before next visit  I will update with a urine check    Blood Pressure Record Sheet To take your blood pressure, you will need a blood pressure machine. You can buy a blood pressure machine (blood pressure monitor) at your clinic, drug store, or online. When choosing one, consider: An automatic monitor that has an arm cuff. A cuff that wraps snugly around your upper arm. You should be able to fit only one finger between your arm and the cuff. A device that stores blood pressure reading results. Do not choose a monitor that measures your blood pressure from your wrist or finger. Follow your health care provider's instructions for how to take your blood pressure. To use this form: Take your blood pressure medications every day These measurements should be taken when you have been at rest for at least 10-15 min Take at least 2 readings with each blood pressure check. This makes sure the results are correct. Wait 1-2 minutes between measurements. Write down the results in the spaces on this form. Keep in mind it should always be recorded systolic over diastolic. Both numbers are important.  Repeat this every day for 2-3 weeks, or as told by your health care provider.  Make a follow-up appointment with your health care provider to discuss the results.  Blood Pressure Log Date Medications taken? (Y/N) Blood Pressure Time of Day                                                                                                       [facility]  [phone]  [facility]  [phone]  [facility] [phone]  Follow up in 2 months  If you haven't already, sign up for My Chart to have easy access to your labs results, and communication with your primary care  physician.   Please arrive 15 minutes before your appointment to ensure smooth check in process.  We appreciate your efforts in making this happen.  Thank you for allowing me to participate in your care, Ernestina Headland, MD 01/19/2024, 10:40 AM PGY-3, [facility] Family Medicine

## 2024-01-19 NOTE — Progress Notes (Signed)
    SUBJECTIVE:   CHIEF COMPLAINT / HPI:   A1C Follow Up:  Type 2 Diabetes: Home medications include: None. Endorse compliance with dietary changes. Home glucose monitoring not performed. Patient is not up to date on diabetic eye. Had eye exam through work, not sure what was tested  Most recent A1Cs:  Lab Results  Component Value Date   HGBA1C 6.3 01/19/2024   HGBA1C 6.0 10/21/2023   Last Microalbumin, LDL, Creatinine: Lab Results  Component Value Date   LDLCALC 82 01/09/2022   CREATININE 0.71 (L) 09/21/2023    SUD He is stable on Suboxone  three times a day and requires a refill for fluoxetine , with no concerns about his current medications. Home blood pressure readings are generally around 125/80 mmHg, with a recent reading of 140/90 mmHg x1 occurrence. He is not on antihypertensive medications. He recently had an eye examination for work, with glaucoma ruled out, but has not had a recent diabetic eye exam. He is in the process of switching insurance, affecting his ability to schedule certain appointments.  PERTINENT  PMH / PSH: Reviewed  History of splenectomy  Obesity SUD  DM2 Hearing Loss  Allergies   OBJECTIVE:   BP (!) 129/95   Pulse 67   Ht 5' (1.524 m)   Wt 228 lb 8 oz (103.6 kg)   SpO2 96%   BMI 44.63 kg/m   General: Alert and oriented in no apparent distress Heart: Regular rate and rhythm with no murmurs appreciated Lungs: Normal wob Abdomen: no abdominal pain Skin: Warm and dry Extremities: No lower extremity edema   ASSESSMENT/PLAN:   Assessment & Plan Type 2 diabetes mellitus without complication, without long-term current use of insulin (HCC) well controlled - Last A1c:  Lab Results  Component Value Date   HGBA1C 6.3 01/19/2024   - Medications: None - diet control - Provided eye doctors in area and BP Log for elevated BP, likely white coat HTN.  - Bring in BP log at next visit  - Cannot schedule with Koval until insurance changes  Substance  use disorder Utox today, if normal - can space out urine checks to q34month. Refilled all meds.  Depression, unspecified depression type Stable, continue current rx - refilled      Ernestina Headland, MD [facility] Medicine Center

## 2024-01-19 NOTE — Assessment & Plan Note (Signed)
 Utox today, if normal - can space out urine checks to q58month. Refilled all meds.

## 2024-01-19 NOTE — Assessment & Plan Note (Addendum)
 well controlled - Last A1c:  Lab Results  Component Value Date   HGBA1C 6.3 01/19/2024   - Medications: None - diet control - Provided eye doctors in area and BP Log for elevated BP, likely white coat HTN.  - Bring in BP log at next visit  - Cannot schedule with Koval until insurance changes

## 2024-01-23 LAB — TOXASSURE SELECT 13 (MW), URINE

## 2024-02-07 ENCOUNTER — Encounter: Payer: Self-pay | Admitting: Family Medicine

## 2024-03-20 ENCOUNTER — Ambulatory Visit (INDEPENDENT_AMBULATORY_CARE_PROVIDER_SITE_OTHER): Admitting: Family Medicine

## 2024-03-20 VITALS — BP 140/83 | HR 73 | Wt 238.2 lb

## 2024-03-20 DIAGNOSIS — F1990 Other psychoactive substance use, unspecified, uncomplicated: Secondary | ICD-10-CM

## 2024-03-20 NOTE — Progress Notes (Signed)
    SUBJECTIVE:   CHIEF COMPLAINT / HPI:   F/u for substance use disorder On suboxone  8-2 TID Recently feels that he has had a viral illness since Saturday, some coughing especially at night. Taking mucinex and tylenol and robitussin   Reports taking oxycodone 5mg  on Saturday night, reports he had craving for this due to his viral illness Reports he got this from a friend   Otherwise denies cravings, feels current suboxone  dose works well  Had not used oxycodone for a long time prior to Saturday     PERTINENT  PMH / PSH: substance use disorder  OBJECTIVE:   BP (!) 140/83 (BP Location: Right Arm, Patient Position: Sitting, Cuff Size: Normal)   Pulse 73   Wt 238 lb 3.2 oz (108 kg)   SpO2 92%   BMI 46.52 kg/m   General: NAD, pleasant, able to participate in exam Cardiac: RRR, no murmurs auscultated Respiratory: CTAB, normal WOB Abdomen: soft, non-tender, non-distended, normoactive bowel sounds Extremities: warm and well perfused, no edema or cyanosis Skin: warm and dry, no rashes noted Neuro: alert, no obvious focal deficits, speech normal Psych: Normal affect and mood  ASSESSMENT/PLAN:   Assessment & Plan Substance use disorder Admits to recent oxycodone use - discussed, pt agrees to avoid this moving forward Continue suboxone  as prescribed, last dispense 7/12 Toxassure today F/u in 1 month   Payton Coward, MD [facility] Medicine Center

## 2024-03-20 NOTE — Assessment & Plan Note (Addendum)
 Admits to recent oxycodone use - discussed, pt agrees to avoid this moving forward Continue suboxone  as prescribed, last dispense 7/12 Toxassure today F/u in 1 month

## 2024-03-20 NOTE — Patient Instructions (Signed)
 Continue taking your suboxone  as prescribed  Avoid using opioids

## 2024-03-22 LAB — TOXASSURE SELECT 13 (MW), URINE

## 2024-03-23 ENCOUNTER — Ambulatory Visit: Payer: Self-pay | Admitting: Family Medicine

## 2024-04-07 ENCOUNTER — Telehealth: Payer: Self-pay

## 2024-04-07 DIAGNOSIS — F1990 Other psychoactive substance use, unspecified, uncomplicated: Secondary | ICD-10-CM

## 2024-04-07 NOTE — Telephone Encounter (Signed)
 Patient calls nurse line requesting refill on Suboxone  8 mg refill.   This is not on current medication list.   Patient requests that refill be sent to [facility].   Forwarding request to PCP.   [name], RN

## 2024-04-10 ENCOUNTER — Telehealth: Payer: Self-pay

## 2024-04-10 DIAGNOSIS — F1990 Other psychoactive substance use, unspecified, uncomplicated: Secondary | ICD-10-CM

## 2024-04-10 MED ORDER — BUPRENORPHINE HCL-NALOXONE HCL 8-2 MG SL FILM: 24.0000 mg | ORAL_FILM | Freq: Every day | SUBLINGUAL | 0 refills | Status: DC

## 2024-04-10 NOTE — Telephone Encounter (Signed)
 Patient calls nurse line in regards to Suboxone  prescription.   He reports difficulty picking this up this morning.   I called the pharmacy. Pharmacy reports Eddie [name]'s DEA # is not going through their system. Verified they have the whole DEA on file as this has been an issue in the past.   Pharmacy reports an attending physician will have to send this in.   PCP prescription cancelled.   Will forward to morning preceptor to send in.

## 2024-04-17 ENCOUNTER — Ambulatory Visit (INDEPENDENT_AMBULATORY_CARE_PROVIDER_SITE_OTHER): Admitting: Family Medicine

## 2024-04-17 VITALS — BP 123/83 | HR 63 | Ht 64.0 in | Wt 232.6 lb

## 2024-04-17 DIAGNOSIS — Z9081 Acquired absence of spleen: Secondary | ICD-10-CM

## 2024-04-17 DIAGNOSIS — Z23 Encounter for immunization: Secondary | ICD-10-CM | POA: Diagnosis not present

## 2024-04-17 DIAGNOSIS — F1990 Other psychoactive substance use, unspecified, uncomplicated: Secondary | ICD-10-CM | POA: Diagnosis not present

## 2024-04-17 NOTE — Assessment & Plan Note (Signed)
-   Reviewed immunization records; completed hepatitis B, tetanus, and meningococcal today - Due for pneumococcal vaccination; unavailable at [facility]

## 2024-04-17 NOTE — Progress Notes (Signed)
    SUBJECTIVE:   CHIEF COMPLAINT / HPI: SUD f/u  Eddie [name] presents for follow-up of substance use disorder. He reports he has not used any opioids since his last visit in July. Currently on Suboxone , tolerating well without side effects such as constipation. Denies cravings or withdrawal symptoms.   PERTINENT  PMH / PSH: Substance use disorder, Splenectomy  OBJECTIVE:   BP 123/83   Pulse 63   Ht 5' 4 (1.626 m)   Wt 232 lb 9.6 oz (105.5 kg)   SpO2 98%   BMI 39.93 kg/m   General: Well-appearing, NAD Cardio: Regular rate and rhythm, no murmurs Respiratory: Normal effort, clear to auscultation bilaterally  ASSESSMENT/PLAN:   Assessment & Plan Substance use disorder - Stable on Suboxone , no cravings or recent use - Tox assure 13 obtained today; will notify patient of results - Continue current Suboxone  regimen - Follow up in 1 month H/O splenectomy - Reviewed immunization records; completed hepatitis B, tetanus, and meningococcal today - Due for pneumococcal vaccination; unavailable at [facility]   Eddie [name], Medical Student [facility] Medicine Center  I was personally present and performed or re-performed the history, physical exam and medical decision making activities of this student/resident and have verified that the student/resident's  findings are accurately documented in the student/resident's note. I have made edits and changes where appropriate, and agree with plan.  Eddie Provencal, MD, PGY-3 [facility] 5:34 PM 04/17/2024                   04/17/2024, 5:34 PM

## 2024-04-17 NOTE — Assessment & Plan Note (Addendum)
-   Stable on Suboxone , no cravings or recent use - Tox assure 13 obtained today; will notify patient of results - Continue current Suboxone  regimen - Follow up in 1 month

## 2024-04-17 NOTE — Patient Instructions (Addendum)
 It was great to see you! Thank you for allowing me to participate in your care!  Our plans for today:  - I will let you know the results of your urine test. - Please let me know when you need a refill of your Suboxone . - I will see you in 1 month - Blood pressure looked good today, please continue to work on your diet and exercise you are doing a great job with this.   Please arrive 15 minutes PRIOR to your next scheduled appointment time! If you do not, this affects OTHER patients' care.  Take care and seek immediate care sooner if you develop any concerns.   Ozell Provencal, MD, PGY-3 [facility] Family Medicine 2:53 PM 04/17/2024  [facility] Family Medicine

## 2024-04-17 NOTE — Progress Notes (Deleted)
    SUBJECTIVE:   CHIEF COMPLAINT / HPI: f/u  SUD   Vaccines for asplenia? Pneumococcal vaccine The Hib vaccine The quadrivalent meningococcal conjugate ACWY vaccine series (MenACWY) The monovalent meningococcal serogroup B vaccine series (MenB-4C or MenB-FHbp)  PERTINENT  PMH / PSH: T2DM, HTN, Hx of splenectomy  OBJECTIVE:   BP 123/83   Pulse 63   Ht 5' 4 (1.626 m)   Wt 232 lb 9.6 oz (105.5 kg)   SpO2 98%   BMI 39.93 kg/m   ***  ASSESSMENT/PLAN:   Assessment & Plan Substance use disorder  No follow-ups on file.  Eddie Provencal, MD [facility]

## 2024-04-19 LAB — TOXASSURE SELECT 13 (MW), URINE

## 2024-04-20 ENCOUNTER — Ambulatory Visit: Payer: Self-pay | Admitting: Family Medicine

## 2024-05-09 ENCOUNTER — Other Ambulatory Visit: Payer: Self-pay

## 2024-05-09 DIAGNOSIS — F1990 Other psychoactive substance use, unspecified, uncomplicated: Secondary | ICD-10-CM

## 2024-05-09 DIAGNOSIS — F32A Depression, unspecified: Secondary | ICD-10-CM

## 2024-05-09 NOTE — Telephone Encounter (Signed)
 Pt has appt on 05/17/2024. Pt requesting enough medication to get him to his appt with Dr Alba on 05/17/2024.   Margit Dimes, CMA

## 2024-05-10 MED ORDER — BUPRENORPHINE HCL-NALOXONE HCL 8-2 MG SL FILM: 24.0000 mg | ORAL_FILM | Freq: Every day | SUBLINGUAL | 0 refills | Status: DC

## 2024-05-10 MED ORDER — FLUOXETINE HCL 40 MG PO CAPS: 40.0000 mg | ORAL_CAPSULE | Freq: Every day | ORAL | 1 refills | Status: DC

## 2024-05-10 NOTE — Telephone Encounter (Signed)
 Patient returns call to nurse line regarding status of refill.   Advised of rx refill policy.   Eddie JAYSON English, RN

## 2024-05-17 ENCOUNTER — Ambulatory Visit (INDEPENDENT_AMBULATORY_CARE_PROVIDER_SITE_OTHER): Admitting: Family Medicine

## 2024-05-17 ENCOUNTER — Encounter: Payer: Self-pay | Admitting: Family Medicine

## 2024-05-17 VITALS — BP 138/82 | HR 64 | Ht 64.0 in | Wt 231.0 lb

## 2024-05-17 DIAGNOSIS — E119 Type 2 diabetes mellitus without complications: Secondary | ICD-10-CM | POA: Diagnosis not present

## 2024-05-17 DIAGNOSIS — Z23 Encounter for immunization: Secondary | ICD-10-CM | POA: Diagnosis not present

## 2024-05-17 DIAGNOSIS — F1990 Other psychoactive substance use, unspecified, uncomplicated: Secondary | ICD-10-CM

## 2024-05-17 LAB — POCT GLYCOSYLATED HEMOGLOBIN (HGB A1C): HbA1c, POC (prediabetic range): 6.4 % (ref 5.7–6.4)

## 2024-05-17 MED ORDER — BUPRENORPHINE HCL-NALOXONE HCL 8-2 MG SL FILM: 24.0000 mg | ORAL_FILM | Freq: Every day | SUBLINGUAL | 0 refills | Status: DC

## 2024-05-17 NOTE — Patient Instructions (Addendum)
 It was great to see you! Thank you for allowing me to participate in your care!  Our plans for today:  - I have refilled your suboxone . - Your diabetes is doing well.  - I will see you again in 3 months.   Please arrive 15 minutes PRIOR to your next scheduled appointment time! If you do not, this affects OTHER patients' care.  Take care and seek immediate care sooner if you develop any concerns.   Eddie Provencal, MD, PGY-3 [facility] Family Medicine 9:18 AM 05/17/2024  [facility] Family Medicine

## 2024-05-17 NOTE — Assessment & Plan Note (Signed)
 Stable, well controlled on Suboxone  8-2 TID.  -Reilled Suboxone  -Can extend to every 3 months f/u, if normal toxassure -Repeat toxassure 13 today

## 2024-05-17 NOTE — Progress Notes (Signed)
    SUBJECTIVE:   CHIEF COMPLAINT / HPI: SUD f/u  SUD - On buprenorphine  8-2mg  TID. 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 Trying to make some extra money. Still working good. Taking 2 in the morning. Taking other later in the afternoon. No constipation. No cravings.  T2DM - Diet controlled.   PERTINENT  PMH / PSH: SUD, T2DM, HTN  OBJECTIVE:   BP 138/82   Pulse 64   Ht 5' 4 (1.626 m)   Wt 231 lb (104.8 kg)   SpO2 98%   BMI 39.65 kg/m   General: NAD, well appearing Neuro: A&O Respiratory: normal WOB on RA Extremities: Moving all 4 extremities equally Foot Exam: sensation intact bilaterally with monofilament, palpable pulses bilaterally  ASSESSMENT/PLAN:   Assessment & Plan Substance use disorder Stable, well controlled on Suboxone  8-2 TID.  -Reilled Suboxone  -Can extend to every 3 months f/u, if normal toxassure -Repeat toxassure 13 today Type 2 diabetes mellitus without complication, without long-term current use of insulin (HCC) A1c today 6.4, controlled. Normal foot exam. Continue diet control. -Repeat A1c 6 months. Need for vaccination Flu and Pneumoccocal vaccine administered today.  Return in about 3 months (around 08/16/2024).  Ozell Provencal, MD [facility] Health [facility]

## 2024-05-17 NOTE — Assessment & Plan Note (Addendum)
 A1c today 6.4, controlled. Normal foot exam. Continue diet control. -Repeat A1c 6 months.

## 2024-05-22 LAB — TOXASSURE SELECT 13 (MW), URINE

## 2024-05-26 ENCOUNTER — Ambulatory Visit: Payer: Self-pay | Admitting: Family Medicine

## 2024-05-29 NOTE — Telephone Encounter (Signed)
 Called patient to discuss tox assure urine results.  Indicates appropriate use of Suboxone .  As discussed at our previous clinic visit, can extend clinic visits to once every 3 months.  Recommended to call our clinic or the pharmacy for refills of Suboxone  monthly.

## 2024-06-13 ENCOUNTER — Other Ambulatory Visit: Payer: Self-pay

## 2024-06-13 DIAGNOSIS — F1990 Other psychoactive substance use, unspecified, uncomplicated: Secondary | ICD-10-CM

## 2024-06-14 MED ORDER — BUPRENORPHINE HCL-NALOXONE HCL 8-2 MG SL FILM: 24.0000 mg | ORAL_FILM | Freq: Every day | SUBLINGUAL | 0 refills | Status: DC

## 2024-07-12 ENCOUNTER — Other Ambulatory Visit: Payer: Self-pay

## 2024-07-12 DIAGNOSIS — F1990 Other psychoactive substance use, unspecified, uncomplicated: Secondary | ICD-10-CM

## 2024-07-12 MED ORDER — BUPRENORPHINE HCL-NALOXONE HCL 8-2 MG SL FILM: 24.0000 mg | ORAL_FILM | Freq: Every day | SUBLINGUAL | 0 refills | Status: DC

## 2024-08-07 ENCOUNTER — Other Ambulatory Visit: Payer: Self-pay

## 2024-08-07 DIAGNOSIS — F1990 Other psychoactive substance use, unspecified, uncomplicated: Secondary | ICD-10-CM

## 2024-08-08 MED ORDER — BUPRENORPHINE HCL-NALOXONE HCL 8-2 MG SL FILM: 24.0000 mg | ORAL_FILM | Freq: Every day | SUBLINGUAL | 0 refills | Status: DC

## 2024-08-11 ENCOUNTER — Telehealth: Payer: Self-pay

## 2024-08-11 NOTE — Telephone Encounter (Signed)
 Spoke with Dr. Alba. He advised that he would not be able to provide additional films.   Called patient back. He said that he does not need additional films, just to pick up his normal rx.   He is adamant that prescription was last picked up on 07/12/24. Advised that the pharmacy would not be able to fill until tomorrow after 8 am due as their system indicates him picking this up on 07/14/24.  Patient voices understanding and that he will likely change his pharmacy prior to next fill.   [name], RN

## 2024-08-11 NOTE — Telephone Encounter (Signed)
 Patient's wife LVM on nurse line regarding issues with picking up rx for Suboxone .   Called pharmacy. Pharmacist reports that the soonest patient can pick up medication is tomorrow morning after 8 am.   Returned call to patient and wife, they did not answer. Will attempt to call later today to provide with update.   [name], RN

## 2024-08-11 NOTE — Telephone Encounter (Signed)
 Called patient's wife again.   Advised that pharmacy cannot fill until tomorrow.   She is asking for Dr. Alba to send in 3 additional films to get patient until tomorrow.   She voices frustration that they are not able to fill early. I asked if patient was taking more than 3 per day as he received a 30 day supply on 11/7. She said that he was not taking more than prescribed, then later said well nobody is perfect.   Will forward to PCP.   [name], RN

## 2024-08-17 ENCOUNTER — Ambulatory Visit: Payer: Self-pay | Admitting: Family Medicine

## 2024-08-17 ENCOUNTER — Encounter: Payer: Self-pay | Admitting: Family Medicine

## 2024-08-17 VITALS — BP 147/107 | HR 84 | Ht 64.0 in | Wt 230.8 lb

## 2024-08-17 DIAGNOSIS — L03221 Cellulitis of neck: Secondary | ICD-10-CM

## 2024-08-17 DIAGNOSIS — F1990 Other psychoactive substance use, unspecified, uncomplicated: Secondary | ICD-10-CM

## 2024-08-17 MED ORDER — DOXYCYCLINE HYCLATE 100 MG PO TABS: 100.0000 mg | ORAL_TABLET | Freq: Two times a day (BID) | ORAL | 0 refills | Status: AC

## 2024-08-17 NOTE — Progress Notes (Signed)
° ° °  SUBJECTIVE:   CHIEF COMPLAINT / HPI: SUD follow-up and neck redness  Discussed the use of AI scribe software for clinical note transcription with the patient, who gave verbal consent to proceed.  History of Present Illness Eddie [name] is a 48 year old male who presents with a painful swelling on the back of his neck.  Neck swelling and pain - Onset approximately four days ago with a small, painful bump on the posterior neck - Swelling has enlarged and now consists of several bumps that have merged into a red, palpable mass - Pain is persistent and most severe upon waking - History of similar swellings in the past, but none for several years  Systemic symptoms - Feverish sensation at night  Analgesic use and response - Tylenol and Advil taken, two tablets every eight hours, with ongoing pain and swelling - Alka Seltzer taken two days ago without improvement in swelling or pain  Suboxone  use - Takes Suboxone , which provides some pain relief in combination with Tylenol or Advil - Currently paying cash for Suboxone  due to lack of active insurance; new coverage expected in January    PERTINENT  PMH / PSH: HTN, Depression, SUD  OBJECTIVE:   BP (!) 161/105   Pulse 84   Ht 5' 4 (1.626 m)   Wt 230 lb 12.8 oz (104.7 kg)   SpO2 96%   BMI 39.62 kg/m   Physical Exam General: NAD, well appearing Neuro: A&O Cardiovascular: RRR, no murmurs,  Respiratory: normal WOB on RA, CTAB, no wheezes, ronchi or rales Abdomen: soft, NTTP, no rebound or guarding Extremities: Moving all 4 extremities equally, no peripheral edema Neck: 5x2 cm area of erythema and induration over midline of neck ~C5-C6  Limited POCUS: Large area of subcutaneous edema but no large area of anechoic fluid to suggest drainable abscess.   ASSESSMENT/PLAN:   Assessment & Plan Substance use disorder Stable on 3 films daily 8-2 Suboxone .  Will refill monthly until next follow-up visit in 3 months.   Tox assure collected today. Cellulitis of neck Vital signs stable, not consistent with abscess at this time. - Discussed reasons to return to care - Doxycycline 100 mg twice daily for 10 days - Tylenol ibuprofen as needed for pain   Return in about 3 months (around 11/15/2024).  Ozell Provencal, MD, PGY-3 [facility] Family Medicine 9:21 AM 08/17/2024  [facility] Family Medicine Center

## 2024-08-17 NOTE — Assessment & Plan Note (Signed)
 Stable on 3 films daily 8-2 Suboxone .  Will refill monthly until next follow-up visit in 3 months.  Tox assure collected today.

## 2024-08-17 NOTE — Patient Instructions (Signed)
 It was great to see you! Thank you for allowing me to participate in your care!  Our plans for today:   VISIT SUMMARY: You came in today because of a painful swelling on the back of your neck that started about four days ago. You also mentioned feeling feverish at night and have been taking Tylenol and Advil for the pain without much relief. We discussed your ongoing use of Suboxone  for substance use disorder and your current lack of insurance.  YOUR PLAN: Cellulitis OF NECK: You have a painful, infection on your neck. -We prescribed antibiotics to help with the infection. -The antibiotic is called doxycycline, you will take it twice daily for 10 days.  Please take this with food.  Please wear sunscreen daily as this can increase your sensitivity to the sun. -If you are having worsening fevers, it is not getting better with antibiotics by day 5 of using antibiotics please return to be seen.  SUBSTANCE USE DISORDER: You are managing your substance use disorder with Suboxone  and have been paying for it out of pocket due to lack of insurance. -Continue taking Suboxone  (Buprenorphine  8-2 mg three times a day). -Keep using Tylenol and Advil for pain relief. -We obtained a urine sample for monitoring. - Please let me know when you need a refill      Please arrive 15 minutes PRIOR to your next scheduled appointment time! If you do not, this affects OTHER patients' care.  Take care and seek immediate care sooner if you develop any concerns.   Ozell Provencal, MD, PGY-3 [facility] Family Medicine 9:20 AM 08/17/2024  [facility] Family Medicine

## 2024-08-22 LAB — TOXASSURE SELECT 13 (MW), URINE

## 2024-08-23 ENCOUNTER — Ambulatory Visit: Payer: Self-pay | Admitting: Family Medicine

## 2024-09-06 ENCOUNTER — Other Ambulatory Visit: Payer: Self-pay

## 2024-09-06 DIAGNOSIS — F32A Depression, unspecified: Secondary | ICD-10-CM

## 2024-09-06 DIAGNOSIS — F1990 Other psychoactive substance use, unspecified, uncomplicated: Secondary | ICD-10-CM

## 2024-09-06 MED ORDER — BUPRENORPHINE HCL-NALOXONE HCL 8-2 MG SL FILM: 24.0000 mg | ORAL_FILM | Freq: Every day | SUBLINGUAL | 0 refills | Status: DC

## 2024-09-06 MED ORDER — FLUOXETINE HCL 40 MG PO CAPS: 40.0000 mg | ORAL_CAPSULE | Freq: Every day | ORAL | 1 refills | Status: AC

## 2024-10-06 ENCOUNTER — Other Ambulatory Visit: Payer: Self-pay

## 2024-10-06 DIAGNOSIS — F1990 Other psychoactive substance use, unspecified, uncomplicated: Secondary | ICD-10-CM

## 2024-10-10 MED ORDER — BUPRENORPHINE HCL-NALOXONE HCL 8-2 MG SL FILM: 24.0000 mg | ORAL_FILM | Freq: Every day | SUBLINGUAL | 0 refills | Status: AC
